# Patient Record
Sex: Male | Born: 1935 | Race: White | Hispanic: No | Marital: Married | Smoking: Former smoker
Health system: Southern US, Community
[De-identification: ages and names within clinical notes are randomized; demographics above are authoritative.]

## PROBLEM LIST (undated history)

## (undated) DIAGNOSIS — I447 Left bundle-branch block, unspecified: Secondary | ICD-10-CM

## (undated) DIAGNOSIS — I639 Cerebral infarction, unspecified: Secondary | ICD-10-CM

## (undated) DIAGNOSIS — Z86711 Personal history of pulmonary embolism: Secondary | ICD-10-CM

## (undated) DIAGNOSIS — Z8673 Personal history of transient ischemic attack (TIA), and cerebral infarction without residual deficits: Secondary | ICD-10-CM

## (undated) DIAGNOSIS — F329 Major depressive disorder, single episode, unspecified: Secondary | ICD-10-CM

## (undated) DIAGNOSIS — IMO0001 Reserved for inherently not codable concepts without codable children: Secondary | ICD-10-CM

## (undated) DIAGNOSIS — F0390 Unspecified dementia without behavioral disturbance: Secondary | ICD-10-CM

## (undated) DIAGNOSIS — N3941 Urge incontinence: Secondary | ICD-10-CM

## (undated) DIAGNOSIS — H409 Unspecified glaucoma: Secondary | ICD-10-CM

## (undated) DIAGNOSIS — I10 Essential (primary) hypertension: Secondary | ICD-10-CM

## (undated) DIAGNOSIS — F32A Depression, unspecified: Secondary | ICD-10-CM

## (undated) DIAGNOSIS — Z86718 Personal history of other venous thrombosis and embolism: Secondary | ICD-10-CM

## (undated) DIAGNOSIS — N400 Enlarged prostate without lower urinary tract symptoms: Secondary | ICD-10-CM

## (undated) HISTORY — PX: REPLACEMENT TOTAL KNEE BILATERAL: SUR1225

## (undated) HISTORY — PX: JOINT REPLACEMENT: SHX530

## (undated) HISTORY — PX: OTHER SURGICAL HISTORY: SHX169

---

## 2013-03-10 ENCOUNTER — Emergency Department (HOSPITAL_COMMUNITY): Payer: Medicare HMO

## 2013-03-10 ENCOUNTER — Encounter (HOSPITAL_COMMUNITY): Payer: Self-pay | Admitting: *Deleted

## 2013-03-10 ENCOUNTER — Inpatient Hospital Stay (HOSPITAL_COMMUNITY)
Admission: EM | Admit: 2013-03-10 | Discharge: 2013-03-12 | DRG: 312 | Disposition: A | Discharge status: Home / Self Care | Payer: Medicare HMO | Attending: Family Medicine | Admitting: Family Medicine

## 2013-03-10 DIAGNOSIS — G8911 Acute pain due to trauma: Secondary | ICD-10-CM

## 2013-03-10 DIAGNOSIS — I951 Orthostatic hypotension: Principal | ICD-10-CM | POA: Diagnosis present

## 2013-03-10 DIAGNOSIS — F32A Depression, unspecified: Secondary | ICD-10-CM

## 2013-03-10 DIAGNOSIS — E785 Hyperlipidemia, unspecified: Secondary | ICD-10-CM | POA: Diagnosis present

## 2013-03-10 DIAGNOSIS — H6120 Impacted cerumen, unspecified ear: Secondary | ICD-10-CM | POA: Diagnosis present

## 2013-03-10 DIAGNOSIS — N138 Other obstructive and reflux uropathy: Secondary | ICD-10-CM | POA: Diagnosis present

## 2013-03-10 DIAGNOSIS — Z86711 Personal history of pulmonary embolism: Secondary | ICD-10-CM

## 2013-03-10 DIAGNOSIS — Z86718 Personal history of other venous thrombosis and embolism: Secondary | ICD-10-CM

## 2013-03-10 DIAGNOSIS — F0280 Dementia in other diseases classified elsewhere without behavioral disturbance: Secondary | ICD-10-CM | POA: Diagnosis present

## 2013-03-10 DIAGNOSIS — N3941 Urge incontinence: Secondary | ICD-10-CM | POA: Diagnosis present

## 2013-03-10 DIAGNOSIS — G309 Alzheimer's disease, unspecified: Secondary | ICD-10-CM | POA: Diagnosis present

## 2013-03-10 DIAGNOSIS — F0391 Unspecified dementia with behavioral disturbance: Secondary | ICD-10-CM | POA: Insufficient documentation

## 2013-03-10 DIAGNOSIS — F3289 Other specified depressive episodes: Secondary | ICD-10-CM | POA: Diagnosis present

## 2013-03-10 DIAGNOSIS — E861 Hypovolemia: Secondary | ICD-10-CM | POA: Diagnosis present

## 2013-03-10 DIAGNOSIS — Z8673 Personal history of transient ischemic attack (TIA), and cerebral infarction without residual deficits: Secondary | ICD-10-CM

## 2013-03-10 DIAGNOSIS — F0390 Unspecified dementia without behavioral disturbance: Secondary | ICD-10-CM

## 2013-03-10 DIAGNOSIS — I440 Atrioventricular block, first degree: Secondary | ICD-10-CM | POA: Diagnosis present

## 2013-03-10 DIAGNOSIS — F329 Major depressive disorder, single episode, unspecified: Secondary | ICD-10-CM | POA: Insufficient documentation

## 2013-03-10 DIAGNOSIS — I10 Essential (primary) hypertension: Secondary | ICD-10-CM

## 2013-03-10 DIAGNOSIS — Z96659 Presence of unspecified artificial knee joint: Secondary | ICD-10-CM

## 2013-03-10 DIAGNOSIS — R55 Syncope and collapse: Secondary | ICD-10-CM

## 2013-03-10 DIAGNOSIS — H409 Unspecified glaucoma: Secondary | ICD-10-CM | POA: Insufficient documentation

## 2013-03-10 DIAGNOSIS — N401 Enlarged prostate with lower urinary tract symptoms: Secondary | ICD-10-CM | POA: Diagnosis present

## 2013-03-10 HISTORY — DX: Personal history of pulmonary embolism: Z86.711

## 2013-03-10 HISTORY — DX: Unspecified glaucoma: H40.9

## 2013-03-10 HISTORY — DX: Depression, unspecified: F32.A

## 2013-03-10 HISTORY — DX: Personal history of other venous thrombosis and embolism: Z86.718

## 2013-03-10 HISTORY — DX: Major depressive disorder, single episode, unspecified: F32.9

## 2013-03-10 HISTORY — DX: Benign prostatic hyperplasia without lower urinary tract symptoms: N40.0

## 2013-03-10 HISTORY — DX: Unspecified dementia, unspecified severity, without behavioral disturbance, psychotic disturbance, mood disturbance, and anxiety: F03.90

## 2013-03-10 HISTORY — DX: Urge incontinence: N39.41

## 2013-03-10 HISTORY — DX: Essential (primary) hypertension: I10

## 2013-03-10 HISTORY — DX: Personal history of transient ischemic attack (TIA), and cerebral infarction without residual deficits: Z86.73

## 2013-03-10 LAB — COMPREHENSIVE METABOLIC PANEL
ALT: 10 U/L (ref 0–53)
AST: 28 U/L (ref 0–37)
Alkaline Phosphatase: 66 U/L (ref 39–117)
Calcium: 9.3 mg/dL (ref 8.4–10.5)
GFR calc Af Amer: 55 mL/min — ABNORMAL LOW (ref >90)
Glucose, Bld: 127 mg/dL — ABNORMAL HIGH (ref 70–99)
Potassium: 4.7 mEq/L (ref 3.5–5.1)
Sodium: 138 mEq/L (ref 135–145)
Total Protein: 6.4 g/dL (ref 6.0–8.3)

## 2013-03-10 LAB — URINE MICROSCOPIC-ADD ON

## 2013-03-10 LAB — CBC WITH DIFFERENTIAL/PLATELET
Basophils Absolute: 0 10*3/uL (ref 0.0–0.1)
Eosinophils Absolute: 0.2 10*3/uL (ref 0.0–0.7)
Lymphocytes Relative: 17 % (ref 12–46)
Lymphs Abs: 1.4 10*3/uL (ref 0.7–4.0)
MCH: 30.4 pg (ref 26.0–34.0)
Neutrophils Relative %: 75 % (ref 43–77)
Platelets: 173 10*3/uL (ref 150–400)
RBC: 4.34 MIL/uL (ref 4.22–5.81)
RDW: 13.6 % (ref 11.5–15.5)
WBC: 8.4 10*3/uL (ref 4.0–10.5)

## 2013-03-10 LAB — URINALYSIS, ROUTINE W REFLEX MICROSCOPIC
Glucose, UA: NEGATIVE mg/dL
Hgb urine dipstick: NEGATIVE
Ketones, ur: NEGATIVE mg/dL
Nitrite: NEGATIVE
Specific Gravity, Urine: 1.024 (ref 1.005–1.030)
pH: 6 (ref 5.0–8.0)

## 2013-03-10 LAB — D-DIMER, QUANTITATIVE: D-Dimer, Quant: 0.83 ug/mL-FEU — ABNORMAL HIGH (ref 0.00–0.48)

## 2013-03-10 MED ORDER — BRIMONIDINE TARTRATE 0.2 % OP SOLN
1.0000 [drp] | Freq: Two times a day (BID) | OPHTHALMIC | Status: DC
  Administered 2013-03-10 – 2013-03-12 (×4): 1 [drp] via OPHTHALMIC
  Filled 2013-03-10: qty 5

## 2013-03-10 MED ORDER — SIMVASTATIN 10 MG PO TABS
10.0000 mg | ORAL_TABLET | Freq: Every day | ORAL | Status: DC
  Administered 2013-03-11: 10 mg via ORAL
  Filled 2013-03-10 (×2): qty 1

## 2013-03-10 MED ORDER — HEPARIN SODIUM (PORCINE) 5000 UNIT/ML IJ SOLN
5000.0000 [IU] | Freq: Three times a day (TID) | INTRAMUSCULAR | Status: DC
  Administered 2013-03-10 – 2013-03-12 (×6): 5000 [IU] via SUBCUTANEOUS
  Filled 2013-03-10 (×8): qty 1

## 2013-03-10 MED ORDER — ACETAMINOPHEN 325 MG PO TABS
650.0000 mg | ORAL_TABLET | Freq: Four times a day (QID) | ORAL | Status: DC | PRN
  Administered 2013-03-10: 650 mg via ORAL
  Filled 2013-03-10: qty 2

## 2013-03-10 MED ORDER — SODIUM CHLORIDE 0.9 % IV BOLUS (SEPSIS)
1000.0000 mL | Freq: Once | INTRAVENOUS | Status: AC
  Administered 2013-03-10: 1000 mL via INTRAVENOUS

## 2013-03-10 MED ORDER — SODIUM CHLORIDE 0.9 % IV SOLN
INTRAVENOUS | Status: AC
  Administered 2013-03-10: 21:00:00 via INTRAVENOUS

## 2013-03-10 MED ORDER — TIMOLOL MALEATE 0.5 % OP SOLN
1.0000 [drp] | Freq: Two times a day (BID) | OPHTHALMIC | Status: DC
  Administered 2013-03-10 – 2013-03-12 (×4): 1 [drp] via OPHTHALMIC
  Filled 2013-03-10: qty 5

## 2013-03-10 MED ORDER — SODIUM CHLORIDE 0.9 % IJ SOLN
3.0000 mL | Freq: Two times a day (BID) | INTRAMUSCULAR | Status: DC
  Administered 2013-03-10 – 2013-03-12 (×3): 3 mL via INTRAVENOUS

## 2013-03-10 MED ORDER — BRIMONIDINE TARTRATE-TIMOLOL 0.2-0.5 % OP SOLN: 1.0000 [drp] | Freq: Two times a day (BID) | OPHTHALMIC | Status: DC

## 2013-03-10 MED ORDER — BRINZOLAMIDE 1 % OP SUSP
1.0000 [drp] | Freq: Two times a day (BID) | OPHTHALMIC | Status: DC
  Administered 2013-03-10 – 2013-03-12 (×4): 1 [drp] via OPHTHALMIC
  Filled 2013-03-10: qty 10

## 2013-03-10 MED ORDER — CLOPIDOGREL BISULFATE 75 MG PO TABS
75.0000 mg | ORAL_TABLET | Freq: Every day | ORAL | Status: DC
  Administered 2013-03-11 – 2013-03-12 (×2): 75 mg via ORAL
  Filled 2013-03-10 (×2): qty 1

## 2013-03-10 MED ORDER — ASPIRIN EC 81 MG PO TBEC
81.0000 mg | DELAYED_RELEASE_TABLET | Freq: Every day | ORAL | Status: DC
  Administered 2013-03-10 – 2013-03-12 (×3): 81 mg via ORAL
  Filled 2013-03-10 (×3): qty 1

## 2013-03-10 MED ORDER — ACETAMINOPHEN 650 MG RE SUPP: 650.0000 mg | Freq: Four times a day (QID) | RECTAL | Status: DC | PRN

## 2013-03-10 MED ORDER — ADULT MULTIVITAMIN W/MINERALS CH
1.0000 | ORAL_TABLET | Freq: Every day | ORAL | Status: DC
  Administered 2013-03-11 – 2013-03-12 (×2): 1 via ORAL
  Filled 2013-03-10 (×2): qty 1

## 2013-03-10 MED ORDER — CITALOPRAM HYDROBROMIDE 20 MG PO TABS
20.0000 mg | ORAL_TABLET | Freq: Every day | ORAL | Status: DC
  Administered 2013-03-11 – 2013-03-12 (×2): 20 mg via ORAL
  Filled 2013-03-10 (×2): qty 1

## 2013-03-10 NOTE — ED Notes (Signed)
Report attempted x 1

## 2013-03-10 NOTE — H&P (Signed)
Family Medicine Teaching [facility] Admission History and Physical Service Pager: [phone]  Patient name: Willie Mayer Medical record number: [id number] Date of birth: [birth year] Age: 79 y.o. Gender: male  Primary Care Provider: Unassigned Consultants: None Code Status: Full Code  Chief Complaint: Presyncope/Syncope  Assessment and Plan: Willie Mayer is a 79 y.o. year old male presenting after a presyncopal or syncopal event caused him to fall backwards today . PMH is significant for history CVA 2.5 years ago (Plavix and ASA), alzheimers dementia recently started on namenda, history DVT/PE 2 years ago, hyperlipidemia.   # Presyncope or Syncope (unclear from history) with orthostatic hypotension Differential includes (arrhythmia, ACS, structural heart disease, PE, orthostatic hypotension from hypovolemia or  Drugs). Doubt CVA or TIA (normal neuro exam. Not planning on CT head at moment), situational (not related to urination, defecation or cough), seizure (no history and no rhythmic movements), hypoglycemia (normal on BMET), carotid etiology (carotid dopplers on 8/10 (through mychart) that showed 16-49% stenosis bilaterally) -orthostatic hypotension most likely cause of syncope or presyncope  -suspect most strongly that this is medication induced (on alpha 1 blocker and namenda as well anticholingergic properties of vesicare placed at risk for falls) with combination of hypovolemia (symptoms improved with hydration) -hold these 3 medications -bed rest or up with 2 person assist -s/p 1 L NS bolus in ED, hydrate 125 hr for 12 hours.  [ ] repeat orthostatics in AM (ordered for 7 am -monitor on telemetry overnight -celexa could prolong qt but none noted on EKG EKG with 1st degree av block and nonspecific conduction delay (does not meet criteria for LBBB as there is no QS or rS in V1 and no RsR' v6) [ ]  AM EKG [ ]  f/u cardiac enzymes x 2 (neg x 1) to rule out ACS [ ]  f/u VQ scan (patient at least at  moderate risk given history DVT/PE) [ ]  f/u 2d echo. Did not appear fluid overloaded so did not order BNP.  *possible etiology of right middle lobe volume loss?  # History CVA-continue plavix and aspirin. No obvious residual deficits but did not have patient stand.   #History of alzheimer's dementia-when establishes with PCP, will need to obtain neuro records. Apparently with recent workup and started on Namenda. Normal 3 word recall in ED.  [ ] obtain MMSE tomorrow  # History DVT/PE-vq scan as above, could consider dopplers of legs as well. AFVSS and no increased work of breathing, tachypnea or hypoxia. Will hold off on anticoagulation for now.   # HLD-continue statin  # Urge Incontinence-hold off on vesicare for now  # BPH-hold home silodosin/rapaflo [ ]  f/u post void residual  # Glaucoma-continue home eye drops  FEN/GI: regular diet Prophylaxis: heparin SQ  Disposition: pending further evaluation and clniical improvement, will need to establish with no PCP in area. Wants to return home. Consider PT consult though will hold off on this until no longer orthostatic.   History of Present Illness: Willie Mayer is a 79 y.o. year old male presenting with presyncope or syncope (not clear from history).   Patient reports standing up from sitting at his independent living facility which he moved to within the last week due to his dementia. When he stood up he became very lightheaded and fell backwards onto the floor. The patient has alzheimer's and wife has parkinsonian dementia (per daughter) and cannot recall all events of the incident. It is not clear whether patient lost consciousness at any point but wife seems to think he had  his eyes open and was talking the whole time. When they attempted to get him onto the stretcher he became lightheaded again. Patient denies any recent or current chest pain, shortness of breath, headache, blurry vision, extremity weakness (other than the fall itself),  orthopnea, PND, shaking of extremities, palpitations, room spinning. Patient noted to have 1st degree av block but wife and daughter state he always has a low HR.   This was similar to an event 2 weeks ago when patient was with daughter. He was standing up and became lightheaded and fell down backwards. This was a day after he had been started on namenda (aug 10 namenda ang aug 11 fall). Did not seek medical help at that time. Patient 2 days ago also reported feeling lightheaded with standing and "head feels funny".   In ED, found to be orthostatic with BP 94/55 sitting to 74/51 standing and reportedly also lightheaded with just sitting up in bed. this improved after a 1L bolus.   Review Of Systems: Per HPI w Otherwise 12 point review of systems was performed and was unremarkable.  Patient Active Problem List   Diagnosis Date Noted  . Dementia   . Hypertension   . History of CVA (cerebrovascular accident)   . History of pulmonary embolism   . Depression   . Glaucoma    Past Medical History: Past Medical History  Diagnosis Date  . Dementia   . Hypertension   . History of CVA (cerebrovascular accident)   . History of DVT (deep vein thrombosis)   . History of pulmonary embolism   . Depression   . Glaucoma   . Urge incontinence   . BPH (benign prostatic hyperplasia)     s/p surgery that "didnt work"   Past Surgical History: Past Surgical History  Procedure Laterality Date  . Replacement total knee bilateral    . Bph surgery     Social History: History  Substance Use Topics  . Smoking status: Former Smoker -- 15.00 packs/day    Quit date: 07/17/1965  . Smokeless tobacco: Never Used  . Alcohol Use: Yes     Comment: occasional gin and tonic, none since moving to [place]   History   Social History Narrative   Recently moved from [place] where he lived with his wife who has parkinsonian dementia. They were independent but moved down here after recent fall and knowledge of dementia.  Live in independent living facility and indendent in [place].    Please also refer to relevant sections of EMR.  Family History: History reviewed. No pertinent family history. Allergies and Medications: No Known Allergies No current facility-administered medications on file prior to encounter.   No current outpatient prescriptions on file prior to encounter.   Scheduled Meds:  Continuous Infusions: . sodium chloride     PRN Meds:.acetaminophen, acetaminophen   Objective: BP 113/58  Pulse 52  Temp(Src) 98.1 F (36.7 C) (Oral)  Resp 18  SpO2 97% Exam: General: no apparent distress, resting slightly uncomfortably in bed (states bed is uncomfortable to his back though no back pain when not laying down) HEENT: Mucous membranes are moist. Extraocular movements intact, hearing aide in left ear, cerumen impaction bilaterally, oropharynx normal Cardiovascular: bradycardic, no murmurs, rubs or gallops. No JVD. No lightheadedness with sitting up.  Respiratory: clear to auscultation bilaterally Abdomen: soft/nontender/nondistended/ No rebound or guarding. No suprapubic discomfort Extremities: no edema Skin: warm, dry Neuro:  CN II-XII intact (couldn't visualize fundus), sensation and reflexes normal throughout, 5/5 muscle  strength in bilateral upper and lower extremities. Normal finger to nose. Normal rapid alternating movements. Did not have patient stand due to recent syncopal event. Did not complete romberg. 3 word recall 3/3. Alert and oriented to (family in room but not president's name although gave physical description, place-hospital in [place], [place], reason for visit-fall, time-year, day of week but thought it was September). Patient seems to forget some details (didn't know why he had moved to [facility]) though.   Labs and Imaging: CBC BMET   Recent Labs Lab 03/10/13 1519  WBC 8.4  HGB 13.2  HCT 38.1*  PLT 173    Recent Labs Lab 03/10/13 1519  NA 138  K 4.7  CL 104  CO2 27   BUN 17  CREATININE 1.38*  GLUCOSE 127*  CALCIUM 9.3      Total Protein 6.4  6.0 - 8.3 g/dL   Albumin 3.1 (*) 3.5 - 5.2 g/dL   AST 28  0 - 37 U/L   ALT 10  0 - 53 U/L   Alkaline Phosphatase 66  39 - 117 U/L   Total Bilirubin 1.0  0.3 - 1.2 mg/dL   GFR calc non Af Amer 47 (*) >90 mL/min   GFR calc Af Amer 55 (*) >90 mL/min  TROPONIN I     Status: None   Collection Time    03/10/13  3:19 PM      Result Value Range   Troponin I <0.30  <0.30 ng/mL  D-DIMER, QUANTITATIVE     Status: Abnormal   Collection Time    03/10/13  3:19 PM      Result Value Range   D-Dimer, Quant 0.83 (*) 0.00 - 0.48 ug/mL-FEU   Dg Chest 2 View  03/10/2013   *RADIOLOGY REPORT*  Clinical Data: Syncope, fall.  CHEST - 2 VIEW  Comparison: None.  Findings: Trachea is midline.  Heart size normal.  There is volume loss in the right middle lobe.  Lungs are otherwise clear.  No pleural fluid.  Flowing anterior osteophytosis in the thoracolumbar spine.  IMPRESSION: Right middle lobe volume loss.  Follow-up to clearing is suggested as a centrally obstructing lesion cannot be excluded.   Original Report Authenticated By: Leanna Battles, M.D.   Shelva Majestic, MD 03/10/2013, 7:05 PM PGY-3, [facility] FPTS Intern pager: [phone], text pages welcome

## 2013-03-10 NOTE — Progress Notes (Addendum)
03/10/2013 9:10 PM  Per patients daughter Georgina Quint [facility]- copy on chart) [phone] patient does have a living will (copy on chart). Pt however requests to remain a full code.; HCPOA in agrees.  Alternative HCPOA Colby Reels absent at this time. MD to be  made aware.   Carlyon Prows RN

## 2013-03-10 NOTE — ED Notes (Signed)
EMS reports that pt. Was at work and went to stand and fell to the ground.  EMS reports that when pt. Sits his B/P is 70/40 and when lying he is 90/50.  EMS also reports that pt. Had an episode like this one week before but did not seek medical attention.  Pt. Does have a PMH of dementia.

## 2013-03-10 NOTE — Progress Notes (Signed)
03/10/2013 7:59 PM  Report received from ED RN.  Order clarification for pulmonary a Perf and Vent. Awaiting patient.   Carlyon Prows RN

## 2013-03-10 NOTE — ED Notes (Signed)
Report given to Oncoming RN.  Pt. Will be transported to floor after floor RN changes shift.

## 2013-03-10 NOTE — ED Provider Notes (Signed)
CSN: [id number]     Arrival date & time 03/10/13  1517 History     First MD Initiated Contact with Patient 03/10/13 1517     Chief Complaint  Patient presents with  . Loss of Consciousness  . Hypotension  . Fall   (Consider location/radiation/quality/duration/timing/severity/associated sxs/prior Treatment) HPI Comments: 79 y.o. M with PMH of dementia and HTN presenting from assisted living facility s/p syncopal episode.  Pt was reportedly arising from wheelchair when he developed lightheadedness and began to fall backwards.  Pt lost consciousness and fell back with impact to buttocks.  He regained consciousness as soon as he was laying down.  No convulsions or post-ictal symptoms.  Pt does not recall how he felt prior to episode.  He states he had a similar episode 1 week ago but did not seek treatment at that time.  He denies HA, vision change, weakness, numbness, chest pain, dyspnea, abdominal pain, N/V/D, or dysuria.  Denies recent fevers or illnesses.  On EMS arrival, pt alert and oriented.  SBP in 70s when sitting, improved to 90s when laying flat.  Vital signs otherwise stable en route.  Patient is a 79 y.o. male presenting with syncope and fall. The history is provided by the patient and the EMS personnel.  Loss of Consciousness Episode history:  Single Most recent episode:  Today Duration:  5 seconds Timing:  Intermittent Progression:  Resolved Chronicity:  Recurrent (second episode this week) Context: standing up   Witnessed: yes   Relieved by:  Lying down Ineffective treatments:  None tried Associated symptoms: no chest pain, no confusion, no diaphoresis, no difficulty breathing, no dizziness, no fever, no focal sensory loss, no focal weakness, no headaches, no nausea, no palpitations, no recent injury, no shortness of breath, no visual change, no vomiting and no weakness   Fall This is a new problem. The current episode started today. Episode frequency: once. The problem has  been unchanged. Pertinent negatives include no abdominal pain, arthralgias, change in bowel habit, chest pain, chills, coughing, diaphoresis, fatigue, fever, headaches, myalgias, nausea, neck pain, numbness, rash, vertigo, visual change, vomiting or weakness. Associated symptoms comments: No head injury. Nothing aggravates the symptoms. He has tried nothing for the symptoms. The treatment provided no relief.    Past Medical History  Diagnosis Date  . Dementia   . Hypertension   . History of CVA (cerebrovascular accident)   . History of DVT (deep vein thrombosis)   . History of pulmonary embolism   . Depression   . Glaucoma   . Urge incontinence   . BPH (benign prostatic hyperplasia)     s/p surgery that "didnt work"   Past Surgical History  Procedure Laterality Date  . Replacement total knee bilateral    . Bph surgery     Family History  Problem Relation Age of Onset  . Alcoholism Father     died in 9s related to complciations   History  Substance Use Topics  . Smoking status: Former Smoker -- 15.00 packs/day    Quit date: 07/17/1965  . Smokeless tobacco: Never Used  . Alcohol Use: Yes     Comment: occasional gin and tonic, none since moving to [place]    Review of Systems  Constitutional: Negative for fever, chills, diaphoresis, appetite change and fatigue.  HENT: Negative for neck pain and neck stiffness.   Eyes: Negative for photophobia and visual disturbance.  Respiratory: Negative for cough, chest tightness, shortness of breath and wheezing.   Cardiovascular: Positive  for syncope. Negative for chest pain, palpitations and leg swelling.  Gastrointestinal: Negative for nausea, vomiting, abdominal pain, diarrhea, blood in stool, abdominal distention and change in bowel habit.  Genitourinary: Negative for dysuria, urgency, frequency, flank pain, decreased urine volume and difficulty urinating.  Musculoskeletal: Negative for myalgias, back pain, arthralgias and gait problem.   Skin: Negative for color change, pallor and rash.  Neurological: Positive for syncope and light-headedness (prior to syncopal episode). Negative for dizziness, vertigo, focal weakness, facial asymmetry, speech difficulty, weakness, numbness and headaches.  Psychiatric/Behavioral: Negative for confusion.  All other systems reviewed and are negative.    Allergies  Review of patient's allergies indicates no known allergies.  Home Medications  No current outpatient prescriptions on file. BP 146/76  Pulse 62  Temp(Src) 97.8 F (36.6 C) (Oral)  Resp 18  Ht 6\' 2"  (1.88 m)  Wt 237 lb (107.502 kg)  BMI 30.42 kg/m2  SpO2 100% Physical Exam  Nursing note and vitals reviewed. Constitutional: He is oriented to person, place, and time. He appears well-developed and well-nourished. No distress.  HENT:  Head: Normocephalic and atraumatic.  Eyes: EOM are normal. Pupils are equal, round, and reactive to light.  Neck: Normal range of motion. Neck supple.  Cardiovascular: Regular rhythm, normal heart sounds and intact distal pulses.  Bradycardia present.   Pulmonary/Chest: Effort normal and breath sounds normal. No respiratory distress. He has no wheezes. He has no rales.  Abdominal: Soft. Bowel sounds are normal. He exhibits no distension. There is no tenderness. There is no rebound and no guarding.  Musculoskeletal: Normal range of motion. He exhibits no edema and no tenderness.  Neurological: He is alert and oriented to person, place, and time. He has normal strength. No cranial nerve deficit or sensory deficit. He exhibits normal muscle tone. He displays a negative Romberg sign. Coordination normal. GCS eye subscore is 4. GCS verbal subscore is 5. GCS motor subscore is 6.  Normal finger-to-nose  Skin: Skin is warm and dry. No rash noted. He is not diaphoretic.    ED Course   Procedures (including critical care time)  Labs Reviewed  CBC WITH DIFFERENTIAL - Abnormal; Notable for the  following:    HCT 38.1 (*)    All other components within normal limits  COMPREHENSIVE METABOLIC PANEL - Abnormal; Notable for the following:    Glucose, Bld 127 (*)    Creatinine, Ser 1.38 (*)    Albumin 3.1 (*)    GFR calc non Af Amer 47 (*)    GFR calc Af Amer 55 (*)    All other components within normal limits  URINALYSIS, ROUTINE W REFLEX MICROSCOPIC - Abnormal; Notable for the following:    Bilirubin Urine SMALL (*)    Leukocytes, UA SMALL (*)    All other components within normal limits  D-DIMER, QUANTITATIVE - Abnormal; Notable for the following:    D-Dimer, Quant 0.83 (*)    All other components within normal limits  TROPONIN I  URINE MICROSCOPIC-ADD ON  TROPONIN I  BASIC METABOLIC PANEL   Dg Chest 2 View  03/10/2013   *RADIOLOGY REPORT*  Clinical Data: Syncope, fall.  CHEST - 2 VIEW  Comparison: None.  Findings: Trachea is midline.  Heart size normal.  There is volume loss in the right middle lobe.  Lungs are otherwise clear.  No pleural fluid.  Flowing anterior osteophytosis in the thoracolumbar spine.  IMPRESSION: Right middle lobe volume loss.  Follow-up to clearing is suggested as a centrally obstructing  lesion cannot be excluded.   Original Report Authenticated By: Leanna Battles, M.D.   1. Syncope   2. Hypertension   3. History of CVA (cerebrovascular accident)   4. History of pulmonary embolism   5. Depression   6. Glaucoma      Date: 03/10/2013  Rate: 55  Rhythm: sinus bradycardia  QRS Axis: normal  Intervals: normal  ST/T Wave abnormalities: normal  Conduction Disutrbances:first-degree A-V block  and nonspecific intraventricular conduction delay  Narrative Interpretation:   Old EKG Reviewed: none available   MDM  79 y.o. M with PMH of dementia and HTN presenting s/p syncopal episode in which he arose from chair, felt lightheaded, then syncopized.  Regained consciousness as soon as he was laying flat.  Pt noted by EMS to be hypotensive in 70s/40s when  sitting up with improvement in BP to 90s/50s when laying flat.  No seizure like activity or post-ictal symptoms.   No head injury per EMS.  On arrival, pt alert and oriented x 3, in NAD.  He is currently asymptomatic.  BP when laying down in 90s/50s. Bradycardia present with HR of 56. Vital signs otherwise stable.  Physical exam unremarkable- no evidence of trauma.  Neuro exam with no focal deficits. Symptoms orthostatic appearing.  Pt does not take beta-blockers. Will check EKG, troponin, labs, CXR, U/A.   D-dimer obtained as pt has prior h/o PE although currently denies chest pain or dyspnea.  EKG shows sinus bradycardia with intraventricular conduction delay, 1st degree AV block. CXR shows R middle lobe volume loss.  Troponin negative.  CBC with no leukocytosis or anemia.  D-dimer elevated to 0.83.  V/Q scan ordered.  Pt to be admitted given recurrent orthostatic hypotension and syncope.  Stable at time of admission.  Discussed with attending Dr. Criss Alvine.     Jodean Lima, MD 03/11/13 [phone]

## 2013-03-11 ENCOUNTER — Inpatient Hospital Stay (HOSPITAL_COMMUNITY): Payer: Medicare HMO

## 2013-03-11 DIAGNOSIS — I10 Essential (primary) hypertension: Secondary | ICD-10-CM

## 2013-03-11 DIAGNOSIS — R55 Syncope and collapse: Secondary | ICD-10-CM

## 2013-03-11 DIAGNOSIS — M545 Low back pain: Secondary | ICD-10-CM

## 2013-03-11 DIAGNOSIS — G8911 Acute pain due to trauma: Secondary | ICD-10-CM

## 2013-03-11 LAB — BASIC METABOLIC PANEL
BUN: 15 mg/dL (ref 6–23)
CO2: 25 mEq/L (ref 19–32)
Chloride: 107 mEq/L (ref 96–112)
Creatinine, Ser: 1.14 mg/dL (ref 0.50–1.35)
Potassium: 4 mEq/L (ref 3.5–5.1)

## 2013-03-11 LAB — TROPONIN I: Troponin I: <0.3 ng/mL (ref <0.30)

## 2013-03-11 MED ORDER — TECHNETIUM TO 99M ALBUMIN AGGREGATED
6.0000 | Freq: Once | INTRAVENOUS | Status: AC | PRN
  Administered 2013-03-11: 6 via INTRAVENOUS

## 2013-03-11 MED ORDER — ACETAMINOPHEN 650 MG RE SUPP
650.0000 mg | Freq: Four times a day (QID) | RECTAL | Status: DC
  Filled 2013-03-11 (×4): qty 1

## 2013-03-11 MED ORDER — ACETAMINOPHEN 325 MG PO TABS
650.0000 mg | ORAL_TABLET | Freq: Four times a day (QID) | ORAL | Status: DC
  Administered 2013-03-11 – 2013-03-12 (×5): 650 mg via ORAL
  Filled 2013-03-11 (×5): qty 2

## 2013-03-11 MED ORDER — TECHNETIUM TC 99M DIETHYLENETRIAME-PENTAACETIC ACID: 40.0000 | Freq: Once | INTRAVENOUS | Status: AC | PRN

## 2013-03-11 NOTE — Progress Notes (Signed)
03/10/13  2130  MD made aware of pt's HCPOA concerns that pt remain a full code. Living will and HCPOA paperwork on chart. Per Dr. Durene Cal. Pt to remain a full code at this time. Family and patient  updated. Pt c/o severe back pain with position changes from laying, to sitting to standing. MD made aware. Tylenol to be given for this discomfort, maintain bedrest and reevaluate in AM. Questionable compression fracture from fall??? Post void residual 132 ml via bladder scan. MD aware.  No new orders received.   Carlyon Prows RN

## 2013-03-11 NOTE — Progress Notes (Signed)
Family Medicine Teaching Service Daily Progress Note Intern Pager: [phone]  Patient name: Willie Mayer Medical record number: [id number] Date of birth: [birth year] Age: 79 y.o. Gender: male  Primary Care Provider: Pcp Not In System Consultants: none Code Status: Full   Pt Overview and Major Events to Date:   Assessment and Plan: Willie Mayer is a 79 y.o. year old male presenting after a presyncopal or syncopal event caused him to fall backwards today . PMH is significant for history CVA 2.5 years ago (Plavix and ASA), alzheimers dementia recently started on namenda, history DVT/PE 2 years ago, hyperlipidemia.   # Presyncope or Syncope (unclear from history) with orthostatic hypotension  Differential includes (arrhythmia, ACS, structural heart disease, PE, orthostatic hypotension from hypovolemia or Drugs). Doubt CVA or TIA (normal neuro exam. Not planning on CT head at moment), situational (not related to urination, defecation or cough), seizure (no history and no rhythmic movements), hypoglycemia (normal on BMET), carotid etiology (carotid dopplers on 8/10 (through mychart) that showed 16-49% stenosis bilaterally)  -orthostatic hypotension most likely cause of syncope or presyncope  -suspect most strongly that this is medication induced (on alpha 1 blocker and namenda as well anticholingergic properties of vesicare placed at risk for falls) with combination of hypovolemia (symptoms improved with hydration)  -hold these 3 medications  -bed rest or up with 2 person assist  -s/p 1 L NS bolus in ED, hydrate 125 hr for 12 hours.  [ ] repeat orthostatics in AM (ordered for 7 am  -monitor on telemetry overnight  -celexa could prolong qt but none noted on EKG  -EKG with 1st degree av block and nonspecific conduction delay (does not meet criteria for LBBB as there is no QS or rS in V1 and no RsR' v6)  - trop (-) x 2  - CXR: right middle lobe volume loss  [ ]  AM EKG    [ ]  f/u VQ scan (patient at least at  moderate risk given history DVT/PE)  [ ]  f/u 2d echo.    #Back Pain - complaining of centralized lumbar pain. Non radiating, localized. 7-8/10. No muscle/stregnth deficts. Exacerbated on movement. No spasm felt on palpation. New pain due to fall? Or chronic pain.  - Tylenol 650 mg PRN for pain  [ ]  Lumbar spine x-ray  [ ]  f/u PT/OT rec's    # History CVA-continue plavix and aspirin. No obvious residual deficits but did not have patient stand.   #History of alzheimer's dementia-when establishes with PCP, will need to obtain neuro records. Apparently with recent workup and started on Namenda. Normal 3 word recall in ED.  [ ] obtain MMSE today or in outpatient setting   # History DVT/PE-vq scan as above, could consider dopplers of legs as well. AFVSS and no increased work of breathing, tachypnea or hypoxia. Will hold off on anticoagulation for now.   # HLD-continue statin   # Urge Incontinence-hold off on vesicare for now   # BPH-hold home silodosin/rapaflo  - 132 mL   # Glaucoma-continue home eye drops   FEN/GI: regular diet  Prophylaxis: heparin SQ  Disposition: pending further evaluation and clniical improvement, will need to establish with no PCP in area. Wants to return home.   Subjective: Patient had no overnight events. He is urinating with no difficulty. Only reports of 7-8/10 back pain in his lumbar region. It only occurs when he moves and doesn't radiate proximally or distally.   Objective: Temp:  [97.7 F (36.5 C)-98.1 F (36.7 C)] 97.7 F (  36.5 C) (08/26 9147) Pulse Rate:  [52-78] 61 (08/26 0634) Resp:  [15-20] 18 (08/26 0608) BP: (74-146)/(51-93) 138/72 mmHg (08/26 0634) SpO2:  [97 %-100 %] 99 % (08/26 0608) Weight:  [236 lb 4.8 oz (107.185 kg)-237 lb (107.502 kg)] 236 lb 4.8 oz (107.185 kg) (08/26 8295) Physical Exam: General: no apparent distress, resting slightly uncomfortably in bed (states bed is uncomfortable to his back though no back pain when not laying  down)  Cardiovascular: no murmurs, rubs or gallops. No JVD. No lightheadedness with sitting up.  Respiratory: clear to auscultation bilaterally  Abdomen: soft/nontender/nondistended/ No rebound or guarding. No suprapubic discomfort  MSK: no paraspinal tenderness, normal strength and sensation in LE  Extremities: no edema  Skin: warm, dry  Neuro: Alert and Oriented x 4 (date, person, city, place), able to stand    Laboratory:  Recent Labs Lab 03/10/13 1519  WBC 8.4  HGB 13.2  HCT 38.1*  PLT 173    Recent Labs Lab 03/10/13 1519 03/11/13 0519  NA 138 140  K 4.7 4.0  CL 104 107  CO2 27 25  BUN 17 15  CREATININE 1.38* 1.14  CALCIUM 9.3 8.7  PROT 6.4  --   BILITOT 1.0  --   ALKPHOS 66  --   ALT 10  --   AST 28  --   GLUCOSE 127* 91      Recent Labs Lab 03/10/13 1519 03/10/13 2330  TROPONINI <0.30 <0.30   8/25: D-Dimer: 0.83 (H)   Imaging/Diagnostic Tests: Dg Chest 2 View  03/10/2013  IMPRESSION: Right middle lobe volume loss. Follow-up to clearing is suggested as a centrally obstructing lesion cannot be excluded.   Clare Gandy, MD 03/11/2013, 6:59 AM PGY-1, [facility] Family Medicine FPTS Intern pager: [phone], text pages welcome

## 2013-03-11 NOTE — Plan of Care (Signed)
Problem: Phase I Progression Outcomes Goal: Hemodynamically stable Outcome: Progressing othostatic bp *1 competed on admission to unit. MD aware. 0700 8/26 pending

## 2013-03-11 NOTE — H&P (Signed)
I examined this patient and discussed the care plan with Dr Durene Cal  and the [facility] team and agree with assessment and plan as documented in the admission note above. Due to significant low back pain after the prior fall, I agree that he should have x-rays to include the lower thoracic spine.

## 2013-03-11 NOTE — Progress Notes (Signed)
PGY-2 Update Note  Discussed pt's case with daughter. Explained that we are looking into various reasons for his ?syncope and falls, and explained that his xrays have shown no fractures. Explained to her that we think his falls could be a combination of several issues (unsteadiness on his feet/balance issues, dehydration and low blood pressure, contribution of medications which we are holding, etc). Daughter explained that pt's mother "ended up with a pacemaker after some falls and passing out about this age."  Will plan to continue per previous notes and follow up on echocardiogram, etc. Will need to consider follow up with cardiology this admission vs outpt after discharge. Will also follow up PT/OT recommendations (unable to evaluate patient today) for possible acute rehab vs home health, etc. Daughter was appreciative of explanations and had no further questions.  Bobbye Morton, MD PGY-2, [facility] Family Medicine 03/11/2013, 5:40 PM FPTS Service pager: [phone] (text pages welcome through [facility])

## 2013-03-11 NOTE — Plan of Care (Signed)
Problem: Phase I Progression Outcomes Goal: OOB as tolerated unless otherwise ordered Outcome: Not Progressing Pt curently bedrest d/t fall and c/o sudden onset back pain with movement

## 2013-03-11 NOTE — Progress Notes (Signed)
  Echocardiogram 2D Echocardiogram has been performed.  Georgian Co 03/11/2013, 9:49 AM

## 2013-03-11 NOTE — Plan of Care (Signed)
Problem: Phase I Progression Outcomes Goal: Pain controlled with appropriate interventions Outcome: Progressing Pt c/o back pain from fall d/t syncope episode.  MD aware.

## 2013-03-11 NOTE — Progress Notes (Signed)
PT Cancellation Note  Patient Details Name: Willie Mayer MRN: [id number] DOB: 06-May-1933   Cancelled Treatment:    Reason Eval/Treat Not Completed: Medical issues which prohibited therapy Awaiting lumbar xray results.   [name],[name] 03/11/2013, 11:23 AM Zenovia Jarred, PT, DPT 03/11/2013 Pager: [phone]

## 2013-03-11 NOTE — ED Provider Notes (Signed)
I saw and evaluated the patient, reviewed the resident's note and I agree with the findings and plan.   2 unprovoked syncopal episodes in last week. No other sx, complicated by dementia. With low BP will need admission for further w/u.   Audree Camel, MD 03/11/13 1047

## 2013-03-11 NOTE — Progress Notes (Signed)
FMTS Attending Daily Note:  Renold Don MD  [phone] pager  Family Practice pager:  [phone] I have discussed this patient with the resident Dr. Jordan Likes and attending physician Dr. Sheffield Slider.  I agree with their findings, assessment, and care plan

## 2013-03-11 NOTE — Progress Notes (Signed)
CSW (Clinical Child psychotherapist) informed that pt may be from ALF. CSW spoke with pt daughter. Pt is from [facility] and is with the Independent Living area. CSW signing off.  Damyiah Moxley, LCSWA [phone]

## 2013-03-12 DIAGNOSIS — F0390 Unspecified dementia without behavioral disturbance: Secondary | ICD-10-CM

## 2013-03-12 DIAGNOSIS — Z8673 Personal history of transient ischemic attack (TIA), and cerebral infarction without residual deficits: Secondary | ICD-10-CM

## 2013-03-12 MED ORDER — POLYETHYLENE GLYCOL 3350 17 G PO PACK: 17.0000 g | PACK | Freq: Every day | ORAL | Status: DC | PRN

## 2013-03-12 MED ORDER — POLYETHYLENE GLYCOL 3350 17 G PO PACK
17.0000 g | PACK | Freq: Every day | ORAL | Status: DC | PRN
  Administered 2013-03-12: 17 g via ORAL
  Filled 2013-03-12 (×2): qty 1

## 2013-03-12 NOTE — Evaluation (Signed)
Physical Therapy Evaluation Patient Details Name: Willie Mayer MRN: [id number] DOB: 04-Aug-1932 Today's Date: 03/12/2013 Time: 1204-1219 PT Time Calculation (min): 15 min  PT Assessment / Plan / Recommendation History of Present Illness  Willie Mayer is a 79 y.o. year old male presenting after a presyncopal or syncopal event caused him to fall backwards today . PMH is significant for history CVA 2.5 years ago (Plavix and ASA), alzheimers dementia recently started on namenda, history DVT/PE 2 years ago, hyperlipidemia.   Clinical Impression  Patient demonstrates good overall mobility with use of rw. No acute PT needs. Anticipate patient will be safe for back to independent living facility with spouse. Will need RW for discharge.    PT Assessment  Patent does not need any further PT services    Follow Up Recommendations  Supervision/Assistance - 24 hour          Equipment Recommendations  Rolling walker with 5" wheels          Precautions / Restrictions Precautions Precautions: Fall   Pertinent Vitals/Pain No pain at this time      Mobility  Bed Mobility Bed Mobility: Supine to Sit;Sitting - Scoot to Edge of Bed Supine to Sit: 7: Independent Sitting - Scoot to Edge of Bed: 7: Independent Details for Bed Mobility Assistance: no assist needed Transfers Transfers: Sit to Stand;Stand to Sit Sit to Stand: 7: Independent Stand to Sit: 7: Independent Ambulation/Gait Ambulation/Gait Assistance: 5: Supervision;4: Min guard Ambulation Distance (Feet): 200 Feet Assistive device: Rolling walker Ambulation/Gait Assistance Details: some instability noted, better with use of rw Gait Pattern: Step-through pattern Gait velocity: initially decreased General Gait Details: improved stability with use of rw         Visit Information  Last PT Received On: 03/12/13 Assistance Needed: +1 History of Present Illness: Willie Mayer is a 79 y.o. year old male presenting after a presyncopal or syncopal  event caused him to fall backwards today . PMH is significant for history CVA 2.5 years ago (Plavix and ASA), alzheimers dementia recently started on namenda, history DVT/PE 2 years ago, hyperlipidemia.        Prior Functioning  Home Living Family/patient expects to be discharged to:: Assisted living (independent living) Home Equipment: None Prior Function Level of Independence: Independent Communication Communication: No difficulties Dominant Hand: Right    Cognition  Cognition Arousal/Alertness: Awake/alert Behavior During Therapy: WFL for tasks assessed/performed Overall Cognitive Status: Within Functional Limits for tasks assessed    Extremity/Trunk Assessment Upper Extremity Assessment Upper Extremity Assessment: Defer to OT evaluation Lower Extremity Assessment Lower Extremity Assessment: Overall WFL for tasks assessed   Balance Balance Balance Assessed: Yes Static Sitting Balance Static Sitting - Balance Support: Feet supported Static Sitting - Level of Assistance: 7: Independent Dynamic Standing Balance Dynamic Standing - Balance Support: No upper extremity supported;During functional activity Dynamic Standing - Level of Assistance: 7: Independent Dynamic Standing - Balance Activities:  (putting on belt) High Level Balance High Level Balance Activites: Side stepping;Backward walking;Direction changes;Turns High Level Balance Comments: good stability with use of rw  End of Session PT - End of Session Equipment Utilized During Treatment: Gait belt Activity Tolerance: Patient tolerated treatment well Patient left: in chair;with call bell/phone within reach Nurse Communication: Mobility status;Other (comment) (needs rw)  GP     Fabio Asa 03/12/2013, 12:57 PM Charlotte Crumb, PT DPT  [phone]

## 2013-03-12 NOTE — Evaluation (Signed)
Occupational Therapy Evaluation Patient Details Name: Willie Mayer MRN: [id number] DOB: 01/10/1933 Today's Date: 03/12/2013 Time: 1205-1226 OT Time Calculation (min): 21 min  OT Assessment / Plan / Recommendation History of present illness Willie Mayer is a 79 y.o. year old male presenting after a presyncopal or syncopal event caused him to fall backwards today . PMH is significant for history CVA 2.5 years ago (Plavix and ASA), alzheimers dementia recently started on namenda, history DVT/PE 2 years ago, hyperlipidemia.    Clinical Impression   Pt admitted with above. Spoke with Case manager Willie Mayer after eval session and she reported that pt's family had questions about obtaining information for driving rehab services. Provided case manager with information for drivers [facility] in [place].  Will sign off.    OT Assessment  Patient does not need any further OT services    Follow Up Recommendations  No OT follow up;Supervision/Assistance - 24 hour    Barriers to Discharge      Equipment Recommendations   (RW)    Recommendations for Other Services    Frequency       Precautions / Restrictions Precautions Precautions: Fall   Pertinent Vitals/Pain See vitals    ADL  Eating/Feeding: Performed;Independent Where Assessed - Eating/Feeding: Chair Grooming: Performed;Supervision/safety Where Assessed - Grooming: Unsupported standing Upper Body Bathing: Simulated;Supervision/safety Where Assessed - Upper Body Bathing: Unsupported sitting Lower Body Bathing: Simulated;Supervision/safety Where Assessed - Lower Body Bathing: Unsupported sit to stand Upper Body Dressing: Performed;Supervision/safety Where Assessed - Upper Body Dressing: Unsupported sitting Lower Body Dressing: Performed;Supervision/safety Where Assessed - Lower Body Dressing: Unsupported sit to stand Toilet Transfer: Simulated;Supervision/safety Toilet Transfer Method:  (ambulating) Acupuncturist:   (bed) Equipment Used: Gait belt;Rolling walker Transfers/Ambulation Related to ADLs: supervision with RW. ADL Comments: Pt with difficulty answering questions regarding PLOF and home setup (h/o dementia) but otherwise cognition appropriate during session.    OT Diagnosis:    OT Problem List:   OT Treatment Interventions:     OT Goals(Current goals can be found in the care plan section)    Visit Information  Last OT Received On: 03/12/13 Assistance Needed: +1 PT/OT Co-Evaluation/Treatment: Yes History of Present Illness: Willie Mayer is a 79 y.o. year old male presenting after a presyncopal or syncopal event caused him to fall backwards today . PMH is significant for history CVA 2.5 years ago (Plavix and ASA), alzheimers dementia recently started on namenda, history DVT/PE 2 years ago, hyperlipidemia.        Prior Functioning     Home Living Family/patient expects to be discharged to:: Assisted living (independent living) Home Equipment: None Prior Function Level of Independence: Independent Communication Communication: No difficulties Dominant Hand: Right         Vision/Perception     Cognition  Cognition Arousal/Alertness: Awake/alert Behavior During Therapy: WFL for tasks assessed/performed Overall Cognitive Status: History of cognitive impairments - at baseline    Extremity/Trunk Assessment Upper Extremity Assessment Upper Extremity Assessment: Overall WFL for tasks assessed Lower Extremity Assessment Lower Extremity Assessment: Overall WFL for tasks assessed     Mobility Bed Mobility Bed Mobility: Supine to Sit;Sitting - Scoot to Edge of Bed Supine to Sit: 7: Independent Sitting - Scoot to Edge of Bed: 7: Independent Details for Bed Mobility Assistance: no assist needed Transfers Transfers: Stand to Sit;Sit to Stand Sit to Stand: 7: Independent Stand to Sit: 7: Independent     Exercise     Balance Balance Balance Assessed: Yes Static Sitting  Balance Static Sitting -  Balance Support: Feet supported Static Sitting - Level of Assistance: 7: Independent Dynamic Standing Balance Dynamic Standing - Balance Support: No upper extremity supported;During functional activity Dynamic Standing - Level of Assistance: 7: Independent Dynamic Standing - Balance Activities:  (putting on belt) High Level Balance High Level Balance Activites: Side stepping;Backward walking;Direction changes;Turns High Level Balance Comments: good stability with use of rw   End of Session OT - End of Session Equipment Utilized During Treatment: Gait belt;Rolling walker Activity Tolerance: Patient tolerated treatment well Patient left: in chair;with call bell/phone within reach Nurse Communication: Mobility status  GO   03/12/2013 Willie Mayer OTR/L Pager [phone] Office [phone]   Willie Mayer 03/12/2013, 2:40 PM

## 2013-03-12 NOTE — Progress Notes (Signed)
Patient's post-void residual was 27 ml. [place], Mitzi Hansen

## 2013-03-12 NOTE — Care Management (Signed)
CM provided pt's daughter information to [facility] in [place], [place]. Daughter will f/u. No further needs form CM. Gala Lewandowsky, RN,BSN [phone]

## 2013-03-12 NOTE — Progress Notes (Signed)
Family Medicine Teaching Service Daily Progress Note Intern Pager: [phone]  Patient name: Willie Mayer Medical record number: [id number] Date of birth: 1932-09-02 Age: 79 y.o. Gender: male  Primary Care Provider: Pcp Not In System Consultants: none Code Status: Full   Pt Overview and Major Events to Date:   Assessment and Plan: Willie Mayer is a 79 y.o. year old male presenting after a presyncopal or syncopal event caused him to fall backwards today . PMH is significant for history CVA 2.5 years ago (Plavix and ASA), alzheimers dementia recently started on namenda, history DVT/PE 2 years ago, hyperlipidemia.   # Presyncope or Syncope (unclear from history) with orthostatic hypotension  Differential includes (arrhythmia, ACS, structural heart disease, PE, orthostatic hypotension from hypovolemia or Drugs). Doubt CVA or TIA (normal neuro exam. Not planning on CT head at moment), situational (not related to urination, defecation or cough), seizure (no history and no rhythmic movements), hypoglycemia (normal on BMET), carotid etiology (carotid dopplers on 8/10 (through mychart) that showed 16-49% stenosis bilaterally)  -orthostatic hypotension most likely cause of syncope or presyncope  -suspect most strongly that this is medication induced (on alpha 1 blocker and namenda as well anticholingergic properties of vesicare placed at risk for falls) with combination of hypovolemia (symptoms improved with hydration)  -hold these 3 medications  -bed rest or up with 2 person assist  -s/p 1 L NS bolus in ED, hydrate 125 hr for 12 hours.  -monitor on telemetry overnight  -EKG with 1st degree av block and nonspecific conduction delay (does not meet criteria for LBBB as there is no QS or rS in V1 and no RsR' v6)  - trop (-) x 2  - CXR: right middle lobe volume loss  [ ]  AM EKG  - 2D ECHO - normal - repeated orthostatic vitals this AM - Negative (137/76 (63) --> 136/66 (66) --> 130/67 (77)) [ ]  contacted [facility], plan to call patient to establish Cardiology follow-up on discharge  #Back Pain - complaining of centralized lumbar pain. Non radiating, localized. 7-8/10. No muscle/stregnth deficts. Exacerbated on movement. No spasm felt on palpation. New pain due to fall? Or chronic pain.  - Tylenol 650 mg PRN for pain  [ ]  Lumbar spine x-ray - No acute [ ]  f/u PT/OT rec's - 24 hour supervision, ordered Rolling Walker  # History CVA-continue plavix and aspirin. No obvious residual deficits but did not have patient stand.   #History of alzheimer's dementia-when establishes with PCP, will need to obtain neuro records. Apparently with recent workup and started on Namenda. Normal 3 word recall in ED.  [ ] obtain MMSE today or in outpatient setting   # History DVT/PE-vq scan as above, could consider dopplers of legs as well. AFVSS and no increased work of breathing, tachypnea or hypoxia. Will hold off on anticoagulation for now.   # HLD-continue statin   # Urge Incontinence-hold off on vesicare for now  - HOLD Vesicare on discharge, due to concern for anticholinergic side effects  # BPH-hold home silodosin/rapaflo  - PVR today 27mL - minimal urinary retention, plan to restart Rapaflo  # Glaucoma-continue home eye drops   FEN/GI: regular diet  Prophylaxis: heparin SQ  Disposition: pending further evaluation and clniical improvement, will need to establish with no PCP in area. Likely discharge today. Scheduled Cardiology follow-up as outpt  Subjective: Patient has no complaints today. Feels better. No dizziness, lightheaded, no CP or SoB. Able to stand and ambulate comfortably.  Objective: Temp:  [97.6  F (36.4 C)-98.1 F (36.7 C)] 97.7 F (36.5 C) (08/27 0406) Pulse Rate:  [59-74] 59 (08/27 0406) Resp:  [18-20] 20 (08/27 0406) BP: (107-172)/(60-87) 150/86 mmHg (08/27 0406) SpO2:  [98 %-99 %] 98 % (08/27 0406) Weight:  [234 lb (106.142 kg)] 234 lb (106.142 kg) (08/27 0406) Physical  Exam: General: no apparent distress, resting slightly uncomfortably in bed (states bed is uncomfortable to his back though no back pain when not laying down)  Cardiovascular: no murmurs, rubs or gallops. No JVD. No lightheadedness with sitting up.  Respiratory: clear to auscultation bilaterally  Abdomen: soft/nontender/nondistended/ No rebound or guarding. No suprapubic discomfort  MSK: no paraspinal tenderness, normal strength and sensation in LE  Extremities: no edema  Skin: warm, dry  Neuro: Alert and Oriented x 4 (date, person, city, place), able to stand    Laboratory:  Recent Labs Lab 03/10/13 1519  WBC 8.4  HGB 13.2  HCT 38.1*  PLT 173    Recent Labs Lab 03/10/13 1519 03/11/13 0519  NA 138 140  K 4.7 4.0  CL 104 107  CO2 27 25  BUN 17 15  CREATININE 1.38* 1.14  CALCIUM 9.3 8.7  PROT 6.4  --   BILITOT 1.0  --   ALKPHOS 66  --   ALT 10  --   AST 28  --   GLUCOSE 127* 91       Recent Labs Lab 03/10/13 1519 03/10/13 2330  TROPONINI <0.30 <0.30   8/25: D-Dimer: 0.83 (H)   Imaging/Diagnostic Tests: Dg Chest 2 View  03/10/2013  IMPRESSION: Right middle lobe volume loss. Follow-up to clearing is suggested as a centrally obstructing lesion cannot be excluded.  8/26 Lumbar Thoracic Xrays IMPRESSION:  Lumbar degenerative changes. Negative for fracture. IMPRESSION:  Mild thoracic disc degeneration. No acute bony abnormality.  8/26 EKG Sinus bradycardia, 1st degree AV Block, LVH with QRS widening and repolarization abnormality  8/26 VQ Scan IMPRESSION:  1. There are some bandlike ventilation defects in the mid lungs  reason the possibility of atelectasis along the major fissures, but  no significant perfusion defect to suggest pulmonary embolus.   8/26 - 2D ECHO - Left ventricle: The cavity size was normal. Wall thickness was normal. Systolic function was normal. The estimated ejection fraction was in the range of 55% to 60%. Wall motion was normal;  there were no regional wall motion abnormalities. Diastolic function is indeterminate. - Left atrium: Poorly visualized - though appears normal sized. - Systemic veins: The IVC was not visualized.  Saralyn Pilar, DO 03/12/2013, 7:32 AM PGY-1, [facility] FPTS Intern pager: [phone], text pages welcome

## 2013-03-12 NOTE — Progress Notes (Signed)
Physical Therapy Discharge Patient Details Name: Willie Mayer MRN: [id number] DOB: 06/29/1933 Today's Date: 03/12/2013 Time: 1204-1219 PT Time Calculation (min): 15 min  Patient discharged from PT services secondary to goals met and no further PT needs identified.  Please see latest therapy progress note for current level of functioning and progress toward goals.    Progress and discharge plan discussed with patient and/or caregiver: Patient/Caregiver agrees with plan  GP     Fabio Asa 03/12/2013, 12:59 PM

## 2013-03-12 NOTE — Care Management (Signed)
Case Manager did speak to pt and daughter and they are agreeable to a Agricultural consultant- rollator with a seat. Cm did fax information to [employer] and if pt is approved they will deliver RW to room today before the pt is d/c. CM will provide pt with information regarding PCP, Urologist and Neurology MD's since the pt is new to [place]. [employer] Liaison  Erika to fax information to hospital. CM did contact [place] with OT to see if there is a [facility] Center to see if pt is in condition to drive. Jenna to contact CM with information. No further needs form CM at this time. Gala Lewandowsky, RN,BSN [phone]

## 2013-03-12 NOTE — Progress Notes (Signed)
FMTS Attending Daily Note:  Renold Don MD  [phone] pager  Family Practice pager:  [phone] I have seen and examined this patient and have reviewed their chart. I have discussed this patient with the resident. I agree with the resident's findings, assessment and care plan.  Additionally:  Patient improved greatly.  Plan for DC home today.  Will have followup outpatient with cardiology for any further outpatient studies.  Agree likely medication related/dehydration.  Tele strip/EKG negative here.  We will set him up for outpatient follow-up with [place] as well.    Tobey Grim, MD 03/12/2013 1:23 PM

## 2013-03-12 NOTE — Progress Notes (Signed)
DC orders received.  Patient stable with no S/S of distress.  Medication and discharge information reviewed with patient and patient's wife & daughter.  Patient DC home with family. [place], Mitzi Hansen

## 2013-03-14 NOTE — Discharge Summary (Signed)
Family Medicine Teaching [facility] Discharge Summary  Patient name: Willie Mayer Medical record number: [id number] Date of birth: 14-Aug-1932 Age: 79 y.o. Gender: male Date of Admission: 03/10/2013  Date of Discharge: 03/12/13 Admitting Physician: Zachery Dauer, MD  Primary Care Provider: Pcp Not In System Consultants: none  Indication for Hospitalization: Presyncope/Syncope   Discharge Diagnoses/Problem List:  Presyncope/Syncope - resolved  Dementia HTN History of CVA Glaucoma BPH  History of DVT/PT HLD  Back pain   Disposition: home   Discharge Condition: Stable   Brief Hospital Course:  Willie Mayer is a 79 y.o. year old male presenting after a presyncopal or syncopal event caused him to fall backwards upon day of admission. PMH is significant for history CVA 2.5 years ago (Plavix and ASA), alzheimers dementia recently started on namenda, history DVT/PE 2 years ago, hyperlipidemia.   # Presyncope or Syncope (unclear from history) with orthostatic hypotension Suspected to be medication induced with combination of hypovolemia. Patient's home medications included Namenda and Vesicare and were held upon admission. R/o other potential causes such as CVA/TIA due to normal neuro exam, seizure due to no reported history, hypoglycemia with normal BMET, carotid etiology (carotid dopplers on 8/10 (through mychart) that showed 16-49% stenosis bilaterally) and troponin (-) x 2 and normal 2D ECHO.  CXR showed middle lobe volume loss. [facility] contacted and planted to call patient to establish care upon discharge since patient is new to the area. Hypovolemia improved with fluids and orthostatics were negative prior to discharge. Namenda and Vesicare were held upon discharge.    #Back Pain - Complaining of centralized lumbar pain. Non radiating, localized. 7-8/10. No muscle/stregnth deficts. Exacerbated on movement. No spasm felt on palpation. Lumbar x-ray showed Lumbar degenerative changes  and  negative for fracture. Thoracic x-ray showed mild thoracic disc degeneration and no acute bony abnormality.  Ordered Rollining walker per PT to help with ADL's.    # History CVA-continue plavix and aspirin.    #History of alzheimer's dementia-when establishes with PCP, will need to obtain neuro records.  Normal 3 word recall in ED. Oriented to city, date, hospital, and person during daily exams. Will need to obtain MMSE in outpatient setting.    # History DVT/PE- D-dimer was elevated at 0.83 but V/Q scan showed there are some bandlike ventilation defects in the mid lungs reason the possibility of atelectasis along the major fissures, but no significant perfusion defect to suggest pulmonary embolus. AFVSS and no increased work of breathing, tachypnea or hypoxia. Will hold off on anticoagulation for now.   # HLD-continue statin   # Urge Incontinence-Vesicare was held initially at admission due to anticholinergic side effects contributing to his symptoms. Due to this, Vesicare was held on discharge   # BPH-hold home silodosin/rapaflo due to suspected contribution to his presyncope/syncope symptoms. Post residual volume shoed minimal urinary retention (27 mL). Rapaflo was restarted at discharge but Silodosin (Vesicare) held upon discharge.    # Glaucoma-continue home eye drops   Issues for Follow Up:  1. Presyncope/Syncope with hypotension: resolved prior to discharge. Namenda and Vesicare were held upon discharge.  2. History of Alzhemier's: obtain MMSE in outpatient setting. Obtain neuro records from previous provider as to why started on Namenda recently.  3. Back pain - determine if still in pain. Chronic changes on imaging.  4. Urge Incontinence - Vesicare held on discharge. Determine if having any symptoms.  5. BPH - showed minimal residual volume retention. Vesicare held on discharge but continued on Rapaflo. Having  any symptoms.   Significant Procedures: none  Significant Labs and  Imaging:   Recent Labs Lab 03/10/13 1519  WBC 8.4  HGB 13.2  HCT 38.1*  PLT 173    Recent Labs Lab 03/10/13 1519 03/11/13 0519  NA 138 140  K 4.7 4.0  CL 104 107  CO2 27 25  GLUCOSE 127* 91  BUN 17 15  CREATININE 1.38* 1.14  CALCIUM 9.3 8.7  ALKPHOS 66  --   AST 28  --   ALT 10  --   ALBUMIN 3.1*  --      Recent Labs Lab 03/10/13 1519 03/10/13 2330  TROPONINI <0.30 <0.30    D-dimer: 0.83  Dg Chest 2 View  03/10/2013 IMPRESSION: Right middle lobe volume loss. Follow-up to clearing is suggested as a centrally obstructing lesion cannot be excluded.   8/26 Lumbar Thoracic Xrays  IMPRESSION:  Lumbar degenerative changes. Negative for fracture.  IMPRESSION:  Mild thoracic disc degeneration. No acute bony abnormality.   8/26 EKG  Sinus bradycardia, 1st degree AV Block, LVH with QRS widening and repolarization abnormality   8/26 VQ Scan  IMPRESSION:  1. There are some bandlike ventilation defects in the mid lungs  reason the possibility of atelectasis along the major fissures, but  no significant perfusion defect to suggest pulmonary embolus.   8/26 - 2D ECHO  - Left ventricle: The cavity size was normal. Wall thickness was normal. Systolic function was normal. The estimated ejection fraction was in the range of 55% to 60%. Wall motion was normal; there were no regional wall motion abnormalities. Diastolic function is indeterminate. - Left atrium: Poorly visualized - though appears normal sized. - Systemic veins: The IVC was not visualized.   Results/Tests Pending at Time of Discharge: none  Discharge Medications:    Medication List    STOP taking these medications       memantine 10 MG tablet  Commonly known as:  NAMENDA     solifenacin 5 MG tablet  Commonly known as:  VESICARE      TAKE these medications       aspirin EC 81 MG tablet  Take 81 mg by mouth daily.     brinzolamide 1 % ophthalmic suspension  Commonly known as:  AZOPT   Place 1 drop into the right eye 2 (two) times daily.     citalopram 20 MG tablet  Commonly known as:  CELEXA  Take 20 mg by mouth daily.     clopidogrel 75 MG tablet  Commonly known as:  PLAVIX  Take 75 mg by mouth daily.     COMBIGAN 0.2-0.5 % ophthalmic solution  Generic drug:  brimonidine-timolol  Place 1 drop into both eyes every 12 (twelve) hours.     multivitamin with minerals Tabs tablet  Take 1 tablet by mouth daily.     polyethylene glycol packet  Commonly known as:  MIRALAX / GLYCOLAX  Take 17 g by mouth daily as needed.     pravastatin 20 MG tablet  Commonly known as:  PRAVACHOL  Take 20 mg by mouth daily.     silodosin 8 MG Caps capsule  Commonly known as:  RAPAFLO  Take 8 mg by mouth daily with breakfast.        Discharge Instructions: Please refer to Patient Instructions section of EMR for full details.  Patient was counseled important signs and symptoms that should prompt return to medical care, changes in medications, dietary instructions, activity restrictions, and follow up  appointments.   Follow-Up Appointments: Follow-up Information   Follow up with Clare Gandy, MD On 03/21/2013. (2:00 pm )    Specialty:  Family Medicine   Contact information:   [address] [place] [address] [phone]       Follow up with [facility] [address] (they will call you with details)    Contact information:   [address] [place] [address]       Clare Gandy, MD 03/17/2013, 10:31 AM PGY-1, [facility]

## 2013-03-17 NOTE — Discharge Summary (Signed)
Family Medicine Teaching Service  Discharge Note : Attending Jeff Keenan Trefry MD Pager [phone] Inpatient Team Pager:  [phone]  I have reviewed this patient and the patient's chart and have discussed discharge planning with the resident at the time of discharge. I agree with the discharge plan as above.    

## 2013-03-21 ENCOUNTER — Encounter: Payer: Self-pay | Admitting: Family Medicine

## 2013-03-21 ENCOUNTER — Ambulatory Visit (INDEPENDENT_AMBULATORY_CARE_PROVIDER_SITE_OTHER): Payer: Medicare HMO | Admitting: Family Medicine

## 2013-03-21 VITALS — BP 113/62 | HR 105 | Temp 97.8°F | Ht 74.0 in | Wt 234.8 lb

## 2013-03-21 DIAGNOSIS — M545 Low back pain: Secondary | ICD-10-CM

## 2013-03-21 DIAGNOSIS — R55 Syncope and collapse: Secondary | ICD-10-CM

## 2013-03-21 DIAGNOSIS — F0390 Unspecified dementia without behavioral disturbance: Secondary | ICD-10-CM

## 2013-03-21 NOTE — Progress Notes (Signed)
  [facility]  Patient name: Willie Mayer MRN [id number]  Date of birth: [birth year]  CC & HPI:  Willie Mayer is a 79 y.o. male presenting today for follow up from a recent hospitalization:  he was admitted for: syncope/ presyncope orthostatic hypotension from medication. Details found in discharge summary.  Symptoms have Improved.   Chief Complaint  Patient presents with  . Establish Care   #Back pain: severe lumbar pain while in the hospital.   #Dementia: Per son in law, he used to be a huge [employer] and enjoyed watching them on TV but doesn't find that interesting anymore. Previous health records need to be released from his previous provider.   ------------------------------------------------------------------------------------------------------------------ Medications:  compliant all of the time   Noted Med Effects:  Vesicare and Namenda due to suspected contribution to hospitalization      ROS:  PER HPI  Pertinent History Reviewed:  Medical & Surgical Hx:  Reviewed Medications: Reviewed & Updated - see associated section Social History: Reviewed -  reports that he quit smoking about 47 years ago. He has never used smokeless tobacco.  Objective Findings:  BP 113/62  Pulse 105  Temp(Src) 97.8 F (36.6 C) (Oral)  Ht 6\' 2"  (1.88 m)  Wt 234 lb 12.8 oz (106.505 kg)  BMI 30.13 kg/m2  Gen: NAD, alert, cooperative with exam CV: RRR, good S1/S2, no murmur Resp: CTABL, no wheezes, non-labored MSK: normal gait, normal sensation in upper and lower extremities, 5/5 in upper and lower extremities.  Ext: No edema, warm Neuro: Alert and oriented, No gross deficits, MMSE 26/30.    Assessment & Plan:  See problem associated charting

## 2013-03-21 NOTE — Patient Instructions (Addendum)
Thank you for coming in,    If anything changes please call in for a follow up in regards to your near passing out. We are not changing any medications as of right now. Once we get your medical records from your previous doctors then we will determine if we need to do any further blood work.   I can be your primary doctor if you choose to. My name is Dr. Clare Gandy.     Please feel free to call with any questions or concerns at any time, at [phone]. --Dr. Jordan Likes

## 2013-03-23 ENCOUNTER — Encounter: Payer: Self-pay | Admitting: Family Medicine

## 2013-03-23 DIAGNOSIS — M545 Low back pain: Secondary | ICD-10-CM | POA: Insufficient documentation

## 2013-03-23 DIAGNOSIS — R55 Syncope and collapse: Secondary | ICD-10-CM | POA: Insufficient documentation

## 2013-03-23 NOTE — Assessment & Plan Note (Signed)
MMSE 26/30. Will retrieve records from previous Neurologist.  - obtain reason as to why Namenda was initiated. Still holding.  - currently lives with his wife at [place]  - doesn't find joy in previous activities but negative depression screening.  - will follow up in a month if symptoms worsen

## 2013-03-23 NOTE — Assessment & Plan Note (Signed)
Recently discharged from hospital for presyncope or syncope most likely to be medication induced.  - Has not occurred since discharge - Holding Vesicare and Candiss Norse  - will obtain records in order to determine why medications were started by previous provider

## 2013-03-23 NOTE — Assessment & Plan Note (Signed)
Much improved since the hospital. Able to stand up with no pain.  - follow up if symptoms persist if or worsen

## 2013-04-23 ENCOUNTER — Encounter: Payer: Self-pay | Admitting: Family Medicine

## 2013-04-23 ENCOUNTER — Ambulatory Visit (INDEPENDENT_AMBULATORY_CARE_PROVIDER_SITE_OTHER): Payer: Medicare HMO | Admitting: Family Medicine

## 2013-04-23 VITALS — BP 110/68 | HR 72 | Temp 98.0°F | Ht 73.5 in | Wt 236.0 lb

## 2013-04-23 DIAGNOSIS — F0391 Unspecified dementia with behavioral disturbance: Secondary | ICD-10-CM

## 2013-04-23 DIAGNOSIS — F0280 Dementia in other diseases classified elsewhere without behavioral disturbance: Secondary | ICD-10-CM

## 2013-04-23 DIAGNOSIS — R32 Unspecified urinary incontinence: Secondary | ICD-10-CM

## 2013-04-23 DIAGNOSIS — F03918 Unspecified dementia, unspecified severity, with other behavioral disturbance: Secondary | ICD-10-CM

## 2013-04-23 DIAGNOSIS — H9190 Unspecified hearing loss, unspecified ear: Secondary | ICD-10-CM

## 2013-04-23 DIAGNOSIS — Z23 Encounter for immunization: Secondary | ICD-10-CM

## 2013-04-23 DIAGNOSIS — R5381 Other malaise: Secondary | ICD-10-CM

## 2013-04-23 DIAGNOSIS — H9193 Unspecified hearing loss, bilateral: Secondary | ICD-10-CM

## 2013-04-23 LAB — CBC
HCT: 42.4 % (ref 39.0–52.0)
MCH: 29.7 pg (ref 26.0–34.0)
MCHC: 33.3 g/dL (ref 30.0–36.0)
MCV: 89.5 fL (ref 78.0–100.0)
RDW: 13.6 % (ref 11.5–15.5)
WBC: 7.9 10*3/uL (ref 4.0–10.5)

## 2013-04-23 NOTE — Patient Instructions (Signed)
Thank you for coming in,   I'm going to do some lab work in order to determine if there is some vitamin deficiency or thyroid disease. Please let me know if your symptoms get worse.   I put in a referral to Dr. Arbutus Leas and an ENT referral for you to get evaluated for new hearing aids.   You received the flu vaccine and prevnair 13 to keep you up to date on vaccines.    Please feel free to call with any questions or concerns at any time, at [phone]. --Dr. Jordan Likes

## 2013-04-23 NOTE — Assessment & Plan Note (Signed)
History of hearing loss. Old hearing aids do not fit.  - referral made to ENT for evaluation by Audiologist

## 2013-04-23 NOTE — Assessment & Plan Note (Signed)
H/O incontinence with past surgery on prostate with no alleviation.  - currently wearing depends  - daughter gives history as patient doesn't realize he is incontinent  - Previous Urologist prescribed Vesicare but was stopped due to recent episode of syncope. Currently on Rapilfo for BPH.  - will watch for now and attain records from previous doctor.

## 2013-04-23 NOTE — Assessment & Plan Note (Addendum)
Patient has h/o Alzhemier's with increased behavioral outbursts.  - Daughter would like to establish care at a Neurologist.  - Referral was placed with Dr. Arbutus Leas (Neurologist)  - Aricept could be used in further treatment pending lab results

## 2013-04-23 NOTE — Progress Notes (Signed)
Patient ID: Willie Mayer, male   DOB: 1933-04-25, 79 y.o.   MRN: [id number] Subjective:    CC: Establish care.   HPI: 79 yo White M with a PMH BPH, CVA, syncope, DVT, Glaucoma, and Alzheimer's. He recently moved to [place] to be closer to his daughter. He and his wife currently reside at [facility]. His daughter is with him in the exam room today. She is doing most of the reporting due to the patients dementia.   Urinary Incontinence: He has a 1-2 year history. He has a history of BPH as well and has had surgery for his prostate. He is currently wearing depends and has several accidents during the day. He goes several times during the day and night. He denies fever, chills, pain, burning or bloody urine.   Fatigue: He has a 2 week increase in the amount of fatigue that he feels. He goes to bed early at night and does not wake up refreshed. He looks forward to eating during the day but still takes a nap in the afternoon. His daughter described his sleepy as "hard." He complains of this extreme exhaustion but denies any depression or sadness. He denies any palpations, or tarry stools, or decrease in appetite. He use to be a social butterfly when he was younger and daughter says that he is just not acting like he normally would.   Hearing loss: he has a h/o of hearing loss bilaterally with the use of hearing aids. He recently quit using his hearing aids because they don't fit his ears anymore.    Past Medical History  Diagnosis Date  . Dementia   . Hypertension   . History of CVA (cerebrovascular accident)   . History of DVT (deep vein thrombosis)   . History of pulmonary embolism   . Depression   . Glaucoma   . Urge incontinence   . BPH (benign prostatic hyperplasia)     s/p surgery that "didnt work"     Family History  Problem Relation Age of Onset  . Alcoholism Father     died in 66s related to complciations  . Heart failure Mother    History   Social History  . Marital Status:  Married    Spouse Name: N/A    Number of Children: N/A  . Years of Education: N/A   Occupational History  . Insurance     Social History Main Topics  . Smoking status: Former Smoker -- 15.00 packs/day    Quit date: 07/17/1965  . Smokeless tobacco: Never Used  . Alcohol Use: Yes     Comment: occasional gin and tonic, none since moving to [place]  . Drug Use: No  . Sexual Activity: No   Other Topics Concern  . Not on file   Social History Narrative   Recently moved from [place] where he lived with his wife who has parkinsonian dementia. They were independent but moved down here after recent fall and knowledge of dementia. Live in independent living facility and indendent in ADLs.       Currently living at [facility] living. Lives with his wife. They help with medication delivery and activities of daily life.      Review of Systems: All other systems reviewed and otherwise normal. .  Objective:   BP 110/68  Pulse 72  Temp(Src) 98 F (36.7 C) (Oral)  Ht 6' 1.5" (1.867 m)  Wt 236 lb (107.049 kg)  BMI 30.71 kg/m2 General: Well Developed, well nourished,  and in no acute distress.  Neuro: Alert and oriented x3, extra-ocular muscles intact, sensation grossly intact. CN 2-12 intact except he didn't respond to the hearing test  HEENT: Normocephalic, atraumatic, pupils equal round reactive to light, neck supple, no masses, no lymphadenopathy, thyroid nonpalpable.  Skin: Warm and dry, no rashes noted.  Cardiac: Regular rate and rhythm, no murmurs rubs or gallops.  Respiratory: Clear to auscultation bilaterally. Not using accessory muscles, speaking in full sentences.  Abdominal: Soft, nontender, nondistended, positive bowel sounds, no masses, no organomegaly.  Musculoskeletal: Shoulder, elbow, wrist, hip, knee, ankle stable, and with full range of motion.   Impression and Recommendations:    The patient was counselled, risk factors were discussed, anticipatory guidance  given. 79

## 2013-04-23 NOTE — Assessment & Plan Note (Signed)
Increasing fatigue as of late regardless of rest and sleep.  - TSH, B12, Vit D, and CBC collected today  - Still maintains good appetite, denies depression or sadness - he feels like he could lay down on the exam table and fall asleep

## 2013-04-24 ENCOUNTER — Other Ambulatory Visit: Payer: Self-pay | Admitting: Family Medicine

## 2013-04-24 DIAGNOSIS — E559 Vitamin D deficiency, unspecified: Secondary | ICD-10-CM | POA: Insufficient documentation

## 2013-04-24 LAB — TSH: TSH: 1.612 u[IU]/mL (ref 0.350–4.500)

## 2013-04-24 MED ORDER — VITAMIN D (ERGOCALCIFEROL) 1.25 MG (50000 UNIT) PO CAPS: 50000.0000 [IU] | ORAL_CAPSULE | ORAL | Status: DC

## 2013-04-24 NOTE — Assessment & Plan Note (Signed)
Vit d Deficiency.  - Will start 50,000 Units once weekly for 8 weeks  - follow up after that 8 weeks and start a daily dosing

## 2013-04-27 ENCOUNTER — Telehealth: Payer: Self-pay | Admitting: Family Medicine

## 2013-04-27 NOTE — Telephone Encounter (Signed)
Pts daughter called the after hours line.  Pt w/ near syncope while taking hot shower. Family later checked BP and reported SBP of 103 lying and 79 standing. No other symptoms reported.  This was addressed during last hospitalization. Vesicare and namenda DC.  Discussed vasovagal, intravascular depletion, autonomic dysregulation vs other cardiac anamoly.  Pt has not been able to f/u w/ cards yet Recommending hydration, close monitoring and call [facility] on Monday for appt next wk. Will fwd to PCP to assist w/ f/u  Shelly Flatten, MD Family Medicine PGY-3 04/27/2013, 2:36 PM

## 2013-04-28 ENCOUNTER — Telehealth: Payer: Self-pay | Admitting: Family Medicine

## 2013-04-28 NOTE — Telephone Encounter (Signed)
Daughter called to remind Dr. Jordan Likes  To call the referral over to  [facility]. They have an appointment set up at the end of the month but they need the referral from [place]. JW

## 2013-04-29 ENCOUNTER — Telehealth: Payer: Self-pay | Admitting: Family Medicine

## 2013-04-29 NOTE — Telephone Encounter (Signed)
Daughter called back to inform Dr. Jordan Likes that her dad's timeline is as follows. August he was in the hospital, he has had 2 appointments, 1 hospital f/u and then a new patient visit. Then on Saturday he had the episode in the shower which was 10/12 and she relayed all the information to Dr. Konrad Dolores . JW

## 2013-05-06 ENCOUNTER — Ambulatory Visit (INDEPENDENT_AMBULATORY_CARE_PROVIDER_SITE_OTHER): Payer: Medicare HMO | Admitting: Family Medicine

## 2013-05-06 ENCOUNTER — Encounter: Payer: Self-pay | Admitting: Family Medicine

## 2013-05-06 VITALS — BP 104/61 | HR 73 | Temp 98.8°F | Ht 74.0 in | Wt 234.0 lb

## 2013-05-06 DIAGNOSIS — R55 Syncope and collapse: Secondary | ICD-10-CM

## 2013-05-06 DIAGNOSIS — F0391 Unspecified dementia with behavioral disturbance: Secondary | ICD-10-CM

## 2013-05-06 DIAGNOSIS — R32 Unspecified urinary incontinence: Secondary | ICD-10-CM

## 2013-05-06 DIAGNOSIS — R5381 Other malaise: Secondary | ICD-10-CM

## 2013-05-06 DIAGNOSIS — M545 Low back pain: Secondary | ICD-10-CM

## 2013-05-06 NOTE — Patient Instructions (Signed)
Your vitals were within normal range today. I also agree with cardiology consultation for your condition since this has happened again after your hospitalization. Follow up with your primary doctor in a month or sooner if needed.

## 2013-05-06 NOTE — Assessment & Plan Note (Signed)
Another episode 9 days ago. This is the 3rd reported per daughter. BP borderline low but no orthostatism today.  Unclear etiology, agree with more extensive work up per cardiology and neurology.

## 2013-05-06 NOTE — Assessment & Plan Note (Addendum)
TSH, B12 and CBC wnl. Vit D low. Pt was started on Vit D treatment.

## 2013-05-06 NOTE — Assessment & Plan Note (Signed)
Has overflow incontinence and still taking Rapaflo.  With this pre-syncopal episodes risk and benefits of this medication has been evaluated. Pt may benefit form Urology consultation. I recommended to avoid this medication as possible.

## 2013-05-06 NOTE — Assessment & Plan Note (Signed)
Per pt it has resolved.

## 2013-05-06 NOTE — Assessment & Plan Note (Signed)
MMSE was not done today due to assessing other complex issues at this visit. He was oriented X3 with good mood and no agitation.  Pt was instructed to f/u with primary doctor to further assess his condition.

## 2013-05-06 NOTE — Progress Notes (Signed)
Family Medicine Office Visit Note   Subjective:   Patient ID: Willie Mayer, male  DOB: [birth year], 79 y.o.. MRN: [id number]   Pt that comes today complaining of another pre-syncopal episode. Pt was admitted recently due to pre-syncope/syncope with hypotension thought to be secondary to hypovolemia and medication induced (Namenda and Vesicare were held upon discharge). Daughter (primary caregiver) brings him today since he had another event on 04/27/13. She reports he was taking a hot shower and was found leaning over the sink, he was transferred to bed and episode resolved. Pt then was afraid to get up late that day. He never fell, BP were normal right after the event. Pt does not recall with clarity the details of the episode.  He continues having overflow incontinence (secondary to BPH) for which he takes Rapaflo.  Pt has appointment scheduled with Cardiology for tomorrow at 10:00am  Regarding his other issues to follow up: 1. Back pain has resolved per pt's report 2. We still don't have available Neurology records. Per daughter reports he was recently diagnosed with Alzheimer Dementia.  Review of Systems:  Pt denies SOB, chest pain, palpitations, headaches, dizziness, numbness or weakness.   Objective:   Physical Exam: Gen:  NAD, good humor and cooperative with examination.  HEENT: Moist mucous membranes  CV: Regular rate and rhythm, no murmurs rubs or gallops. BP sitting:106/65, lying: 103/63, standing: 101/61 PULM: Clear to auscultation bilaterally. No wheezes/rales/rhonchi ABD: Soft, non tender, non distended, normal bowel sounds EXT: No edema Neuro: Alert and oriented in person, place and time. No focalization. MMSE was not done today due to length of visit.  Psych: normal affect and mood. Normal speech and thought process. No agitation.  Assessment & Plan:

## 2013-05-07 ENCOUNTER — Encounter: Payer: Self-pay | Admitting: Cardiology

## 2013-05-07 ENCOUNTER — Ambulatory Visit (INDEPENDENT_AMBULATORY_CARE_PROVIDER_SITE_OTHER): Payer: Medicare HMO | Admitting: Cardiology

## 2013-05-07 VITALS — BP 118/68 | HR 68 | Ht 74.0 in | Wt 237.4 lb

## 2013-05-07 DIAGNOSIS — F0391 Unspecified dementia with behavioral disturbance: Secondary | ICD-10-CM

## 2013-05-07 DIAGNOSIS — R0609 Other forms of dyspnea: Secondary | ICD-10-CM

## 2013-05-07 DIAGNOSIS — Z8673 Personal history of transient ischemic attack (TIA), and cerebral infarction without residual deficits: Secondary | ICD-10-CM

## 2013-05-07 DIAGNOSIS — I10 Essential (primary) hypertension: Secondary | ICD-10-CM

## 2013-05-07 DIAGNOSIS — R0600 Dyspnea, unspecified: Secondary | ICD-10-CM

## 2013-05-07 DIAGNOSIS — I951 Orthostatic hypotension: Secondary | ICD-10-CM

## 2013-05-07 NOTE — Patient Instructions (Signed)
Liberalize salt intake and stay well hydrated.  Continue your current therapy  If orthostatic hypotension recurs may need to consider stopping Rapaflo.

## 2013-05-07 NOTE — Progress Notes (Signed)
Willie Mayer Date of Birth: 1932/08/22 Medical Record #[id number]  History of Present Illness: Willie Mayer is seen at the request of Dr.  Likes for evaluation of orthostatic dizziness. Patient is a pleasant 79 year old white male. He has a history of Alzheimer's dementia. He is seen with his daughter who provides the history. Patient has a history of prior CVA 2 years ago. About 2 months following this he had a DVT with pulmonary embolus. He has had progressive memory loss and was diagnosed this past summer with Alzheimer's disease. He was started on Namenda. The following day he was dancing at a family function and he suddenly fell down. The daughter reports that he had had a couple of drinks. About 2 weeks later he had an episode getting up from the dining room table where he fainted. He was admitted to the hospital. He was found to be orthostatic. Namenda and VESIcare were held. Since then he had done well up until October 12. After taking a shower he was found leaning over his sink gasping for breath. His daughter reports that he was orthostatic again at that time. It is noted that he had an echocardiogram in the hospital in August. This was unremarkable. He apparently has had previous cardiac evaluation with stress testing in [place] but none recently. He has no symptoms of chest pain or shortness of breath. His biggest problem is a lack of energy. His daughter reports that he will get up and eat, then go right back to bed and spends most of the day in the bed watching TV.  Current Outpatient Prescriptions on File Prior to Visit  Medication Sig Dispense Refill  . brimonidine-timolol (COMBIGAN) 0.2-0.5 % ophthalmic solution Place 1 drop into both eyes every 12 (twelve) hours.      . brinzolamide (AZOPT) 1 % ophthalmic suspension Place 1 drop into the right eye 2 (two) times daily.      . citalopram (CELEXA) 20 MG tablet Take 20 mg by mouth daily.      . clopidogrel (PLAVIX) 75 MG tablet Take 75 mg by  mouth daily.      . Multiple Vitamin (MULTIVITAMIN WITH MINERALS) TABS tablet Take 1 tablet by mouth daily.      . pravastatin (PRAVACHOL) 20 MG tablet Take 20 mg by mouth daily.      . silodosin (RAPAFLO) 8 MG CAPS capsule Take 8 mg by mouth daily with breakfast.      . Vitamin D, Ergocalciferol, (DRISDOL) 50000 UNITS CAPS capsule Take 1 capsule (50,000 Units total) by mouth once a week.  8 capsule  0   No current facility-administered medications on file prior to visit.    No Known Allergies  Past Medical History  Diagnosis Date  . Dementia   . Hypertension   . History of CVA (cerebrovascular accident)   . History of DVT (deep vein thrombosis)   . History of pulmonary embolism   . Depression   . Glaucoma   . Urge incontinence   . BPH (benign prostatic hyperplasia)     s/p surgery that "didnt work"    Past Surgical History  Procedure Laterality Date  . Replacement total knee bilateral    . Bph surgery      History  Smoking status  . Former Smoker -- 15.00 packs/day  . Quit date: 07/17/1965  Smokeless tobacco  . Never Used    History  Alcohol Use  . Yes    Comment: occasional gin and tonic, none since moving  to     Family History  Problem Relation Age of Onset  . Alcoholism Father     died in 28s related to complciations  . Heart failure Mother   . Arrhythmia Mother     Review of Systems: The review of systems is positive for fatigue.  All other systems were reviewed and are negative.  Physical Exam: BP 118/68  Pulse 68  Ht 6\' 2"  (1.88 m)  Wt 237 lb 6.4 oz (107.684 kg)  BMI 30.47 kg/m2  SpO2 98% He is a very pleasant elderly white male in no acute distress. HEENT: Normocephalic, atraumatic. Pupils equal round and reactive to light and accommodation. Extraocular movements are full. Oropharynx is clear. Neck is without jugular venous distention, adenopathy, thyromegaly, or bruits. Lungs: Clear Cardiovascular: Regular rate and rhythm. S1 and S2. No  gallop, murmur, or click. Abdomen: Soft and nontender. No masses or bruits. Bowel sounds are positive. Extremities: No cyanosis or edema. Pulses are 2+ and symmetric. Skin: Warm and dry Neuro: Alert and oriented x3. Cranial nerves II through XII are intact. Memory is poor.  LABORATORY DATA: ECG dated 03/11/2013 shows normal sinus rhythm with LVH and QRS widening.  Echo:Study Conclusions  - Procedure narrative: Transthoracic echocardiography. Image quality was adequate. The study was technically difficult, as a result of poor acoustic windows. - Left ventricle: The cavity size was normal. Wall thickness was normal. Systolic function was normal. The estimated ejection fraction was in the range of 55% to 60%. Wall motion was normal; there were no regional wall motion abnormalities. Diastolic function is indeterminate. - Left atrium: Poorly visualized - though appears normal sized. - Systemic veins: The IVC was not visualized.    Assessment / Plan: 1. Orthostatic hypotension. He is not orthostatic today by my exam. This may be exacerbated by his use of Rapaflo. Currently he is not having significant symptomatology. If he should have more frequent symptoms associated with orthostasis then we may need to consider stopping Rapaflo. For now I would recommend  liberalizing the sodium in his diet and wearing compression stockings.  2. Fatigue. I think a lot of this is related to his Alzheimer's with behavioral changes. He is spending much of his day in bed. I do not see a cardiac cause for this. Patient is scheduled to see neurology next week for evaluation.  3. History of CVA.  4. History of DVT.  Plan: At this point I see no active cardiac issues. He has no significant ischemic symptoms. With his progressive Alzheimer's I'm not sure that I would pursue further evaluation at this point. Echocardiogram was unremarkable. I discussed this with the daughter and she is satisfied. She just wanted  to make sure that he did not have other cardiac issues at this time. I will see him back as needed.

## 2013-05-13 ENCOUNTER — Ambulatory Visit (INDEPENDENT_AMBULATORY_CARE_PROVIDER_SITE_OTHER): Payer: Medicare HMO | Admitting: Neurology

## 2013-05-13 ENCOUNTER — Encounter: Payer: Self-pay | Admitting: Neurology

## 2013-05-13 VITALS — BP 100/60 | HR 70 | Temp 97.6°F | Ht 74.0 in | Wt 238.0 lb

## 2013-05-13 DIAGNOSIS — F0280 Dementia in other diseases classified elsewhere without behavioral disturbance: Secondary | ICD-10-CM

## 2013-05-13 MED ORDER — DONEPEZIL HCL 5 MG PO TABS: 5.0000 mg | ORAL_TABLET | Freq: Every day | ORAL | Status: DC

## 2013-05-13 NOTE — Progress Notes (Signed)
NEUROLOGY CONSULTATION NOTE  Jamauri Kruzel MRN: [id number] DOB: 1933/03/10  Referring provider: Dr. Jordan Likes Primary care provider: Dr. Jordan Likes  Reason for consult:  Dementia.  HISTORY OF PRESENT ILLNESS: Willie Mayer is a 79 year old right-handed man with history of dementia, stroke, DVT, hypertension, orthostatic hypotension and depression who presents for dementia.  He is accompanied by his daughter.  He recently moved here from [place].  Records and images were personally reviewed where available.    Symptoms were first noted about a couple of years ago.  At first, he would often repeat questions.  He used to have a great sense of direction, however he began getting lost while driving.  His memory to detail used to be good.  He had an MRI of the brain performed around that time, which revealed a stroke, age indeterminant.  Over the next two years, memory had progressively worsened.  He had a neuropsychological evaluation performed on 02/12/13, which revealed impairment in multiple domains and was felt to be a neurodegenerative process, rather than a cerebrovascular event.  He saw a neurologist once this past summer, who started him on Namenda.  The next day he began having orthostatic hypotension, so some of his medications were discontinued, including the Namenda.  A month later, he had another near-syncopal event.  He is more impatient now, but otherwise he does not have any change in behavior.  He denies feeling depressed.  He does not have hallucinations or delusions.  He does not exhibit abnormal movements or parkinsonism.  Overall, he performs all ADLs, but more recently it has been a chore for family to get him to take a shower.  For the past year or so, he has had hypersomnia.  He sleeps a good 10 hours a night and feels rested in the morning.  Except he will easily feel tired and take deep sleep naps.  He had a sleep study about a year ago, which was unremarkable.  He recently moved here from  [place] to live near his daughter.  He and his wife live in an independent living facility.  He takes interest in the various activities offered, such as music concerts and volleyball.  He had trouble remembering to take his medications, but now an aide comes in daily to help administer.  He no longer drives.  He no longer manages his finances alone.    His wife has Parkinson's disease.  Over the past two weeks, he reportedly has had trouble with his left leg.  He says it is difficult to move due to pain in the anterior thigh.  He recently complained of back pain, but not presently.  He and his daughter are asking about discoloration of his left foot.  04/23/13:  TSH 1.612, B12 461, vitamin D 25-Hydroxy 26.  Recent carotid doppler with no hemodynamically significant ICA stenosis.  PAST MEDICAL HISTORY: Past Medical History  Diagnosis Date  . Dementia   . Hypertension   . History of CVA (cerebrovascular accident)   . History of DVT (deep vein thrombosis)   . History of pulmonary embolism   . Depression   . Glaucoma   . Urge incontinence   . BPH (benign prostatic hyperplasia)     s/p surgery that "didnt work"    PAST SURGICAL HISTORY: Past Surgical History  Procedure Laterality Date  . Replacement total knee bilateral    . Bph surgery      MEDICATIONS: Current Outpatient Prescriptions on File Prior to Visit  Medication Sig  Dispense Refill  . brimonidine-timolol (COMBIGAN) 0.2-0.5 % ophthalmic solution Place 1 drop into both eyes every 12 (twelve) hours.      . brinzolamide (AZOPT) 1 % ophthalmic suspension Place 1 drop into the right eye 2 (two) times daily.      . citalopram (CELEXA) 20 MG tablet Take 20 mg by mouth daily.      . clopidogrel (PLAVIX) 75 MG tablet Take 75 mg by mouth daily.      . Multiple Vitamin (MULTIVITAMIN WITH MINERALS) TABS tablet Take 1 tablet by mouth daily.      . pravastatin (PRAVACHOL) 20 MG tablet Take 20 mg by mouth daily.      . silodosin (RAPAFLO)  8 MG CAPS capsule Take 8 mg by mouth daily with breakfast.      . Vitamin D, Ergocalciferol, (DRISDOL) 50000 UNITS CAPS capsule Take 1 capsule (50,000 Units total) by mouth once a week.  8 capsule  0   No current facility-administered medications on file prior to visit.    ALLERGIES: No Known Allergies  FAMILY HISTORY: Family History  Problem Relation Age of Onset  . Alcoholism Father     died in 51s related to complciations  . Heart failure Mother   . Arrhythmia Mother     SOCIAL HISTORY: History   Social History  . Marital Status: Married    Spouse Name: N/A    Number of Children: N/A  . Years of Education: N/A   Occupational History  . Insurance     Social History Main Topics  . Smoking status: Former Smoker -- 15.00 packs/day    Quit date: 07/17/1965  . Smokeless tobacco: Never Used  . Alcohol Use: Yes     Comment: occasional gin and tonic, none since moving to [place]  . Drug Use: No  . Sexual Activity: No   Other Topics Concern  . Not on file   Social History Narrative   Recently moved from [place] where he lived with his wife who has parkinsonian dementia. They were independent but moved down here after recent fall and knowledge of dementia. Live in independent living facility and indendent in ADLs.       Currently living at [facility] living. Lives with his wife. They help with medication delivery and activities of daily life.     REVIEW OF SYSTEMS: Constitutional: No fevers, chills, or sweats, no generalized fatigue, change in appetite Eyes: No visual changes, double vision, eye pain Ear, nose and throat: No hearing loss, ear pain, nasal congestion, sore throat Cardiovascular: No chest pain, palpitations Respiratory:  No shortness of breath at rest or with exertion, wheezes GastrointestinaI: No nausea, vomiting, diarrhea, abdominal pain, fecal incontinence Genitourinary:  No dysuria, urinary retention or frequency Musculoskeletal:  No neck pain,  back pain Integumentary: No rash, pruritus, skin lesions Neurological: as above Psychiatric: No depression, insomnia, anxiety Endocrine: No palpitations, fatigue, diaphoresis, mood swings, change in appetite, change in weight, increased thirst Hematologic/Lymphatic:  No anemia, purpura, petechiae. Allergic/Immunologic: no itchy/runny eyes, nasal congestion, recent allergic reactions, rashes  PHYSICAL EXAM: Filed Vitals:   05/13/13 1520  BP: 100/60  Pulse: 70  Temp: 97.6 F (36.4 C)   General: No acute distress Head:  Normocephalic/atraumatic Neck: supple, no paraspinal tenderness, full range of motion Back: No paraspinal tenderness Left Leg: positive pedal pulse, skin warm to touch, no pain to palpation of calf or thigh.  FABER test and positive leg raise test negative. Heart: regular rate and rhythm Lungs:  Clear to auscultation bilaterally. Vascular: No carotid bruits. Neurological Exam: Mental status: alert and oriented to person, place (except county and floor), and time, speech fluent and not dysarthric, recalls 1/3 words, able to name, repeat, write and read.  Able to follow 3 step command across midline.  Able to draw intersecting pentagons and clock correctly.  MMSE 25/30. Cranial nerves: CN I: not tested CN II: pupils equal, round and reactive to light, visual fields intact, fundi unremarkable. CN III, IV, VI:  full range of motion, no nystagmus, no ptosis CN V: facial sensation intact CN VII: upper and lower face symmetric CN VIII: hearing intact CN IX, X: gag intact, uvula midline CN XI: sternocleidomastoid and trapezius muscles intact CN XII: tongue midline Bulk & Tone: normal, no fasciculations. Motor: 5/5 throughout. Sensation: endorses reduced pinprick sensation over the dorsum of left foot and medial left lower leg. Deep Tendon Reflexes: 2+ and symmetric in UEs, trace in patellars, absent in ankles, toes down Finger to nose testing: normal Heel to shin:  normal Gait: normal stride, able to walk in tandem. Romberg negative.  IMPRESSION: Alzheimer's dementia.  May also have underlying lumbar radiculopathy.  PLAN: 1.  We will try Aricept 5mg  daily for a month and then 10mg  daily thereafter if tolerated. 2.  Exercise, social interaction, brain teasers.  Try to stay stimulated during the day to prevent naps. 3.  Provide information regarding Alzheimer's support groups 4.  Offered PT for leg and back.  He says it is not too bad at this time.  I did recommend speaking with his PCP regarding concerns of foot discoloration.  Exam does not suggest any evidence for DVT. 5.  Follow up in 6 months.  60 minutes spent with patient and daughter, over 50% spent reviewing outside notes, counseling and coordinating care.  Thank you for allowing me to take part in the care of this patient.  Shon Millet, DO  CC:  Clare Gandy, MD

## 2013-05-13 NOTE — Patient Instructions (Addendum)
1.  We will start donepezil (Aricept) 5mg  daily for four weeks.  If you are tolerating the medication, then after four weeks, we will increase the dose to 10mg  daily.  Side effects include nausea, vomiting, diarrhea, vivid dreams, and muscle cramps.  Please call the clinic if you experience any of these symptoms.  2.  Stay active.  Exercise, remain social.  Participate in social activities. 3.  Read and perform brain teasers.  Do not sit all day watching TV 4.  We will provide info on Alzheimer's support groups in the area. 5.  Follow up in 6 months. 6.  Discuss with Dr. Jordan Likes regarding left foot.

## 2013-05-30 ENCOUNTER — Encounter: Payer: Self-pay | Admitting: Family Medicine

## 2013-05-30 ENCOUNTER — Ambulatory Visit (INDEPENDENT_AMBULATORY_CARE_PROVIDER_SITE_OTHER): Payer: Medicare HMO | Admitting: Family Medicine

## 2013-05-30 VITALS — BP 110/56 | HR 66 | Temp 97.8°F | Ht 74.0 in | Wt 238.5 lb

## 2013-05-30 DIAGNOSIS — H409 Unspecified glaucoma: Secondary | ICD-10-CM

## 2013-05-30 DIAGNOSIS — L989 Disorder of the skin and subcutaneous tissue, unspecified: Secondary | ICD-10-CM

## 2013-05-30 NOTE — Assessment & Plan Note (Signed)
Referral to Dermatology  - due to location on his left temporal region, it was decided to refer.  - His daughter who is the primary caregiver to him and his wife expressed that she wants to make them comfortable in furthering treatment. She doesn't want to go overboard (ex. Open heart surgery).  This was asked due to the lesion having the potential of being cancerous. She understood and was agreeable to the referral. It was also thought that it would cause Willie Mayer wife, who has an anxiety/panic problem, excess worry if it was not evaluated. Willie Mayer called two times while I was in the room evaluating Willie Mayer asking if I had checked his skin lesion.

## 2013-05-30 NOTE — Patient Instructions (Signed)
Thank you for coming in,   I am referring you to an Opthalmologist and a Dermatologist that will evaluate your eyes and skin.   Please let me know how these appointments go.     Please feel free to call with any questions or concerns at any time, at [phone]. --Dr. Jordan Likes

## 2013-05-30 NOTE — Progress Notes (Signed)
Subjective:     Patient ID: Willie Mayer., male   DOB: March 13, 1933, 79 y.o.   MRN: [id number]  HPI  Willie Mayer is a 79 yo M here for a referral to an ophthalmologist and a lesion on his head.   #Glaucoma: Has a history of glaucoma. He had been followed by an ophthalmologist in [place] prior to moving to [place].  He had scheduled appointments every 3 months. He is currently on medication. States no changes in his vision.  He thinks he was initially diagnosed 2-3 years ago.   #skin lesion on left temporal region: His wife has noticed a lesion on his left temporal region. It doesn't bother him and he doesn't notice it. It doesn't itch, ooze, or bleed. He is not a good  historian but his wife feels that it is a new lesion.   Review of Systems All other systems reviewed and otherwise normal.      Objective:   Physical Exam BP 110/56  Pulse 66  Temp(Src) 97.8 F (36.6 C) (Oral)  Ht 6\' 2"  (1.88 m)  Wt 238 lb 8 oz (108.183 kg)  BMI 30.61 kg/m2 Gen: NAD, alert, cooperative with exam HEENT:  EOMI, PERRL, irregular, poorly defined black lesion       Assessment:         Plan:

## 2013-05-30 NOTE — Assessment & Plan Note (Signed)
Referral to Opthalmology

## 2013-06-03 ENCOUNTER — Encounter: Payer: Self-pay | Admitting: Family Medicine

## 2013-06-06 ENCOUNTER — Telehealth: Payer: Self-pay | Admitting: Neurology

## 2013-06-06 NOTE — Telephone Encounter (Signed)
Please call regarding med update / Willie S.

## 2013-06-09 ENCOUNTER — Other Ambulatory Visit: Payer: Self-pay | Admitting: Neurology

## 2013-06-09 MED ORDER — DONEPEZIL HCL 10 MG PO TABS: ORAL_TABLET | ORAL | Status: DC

## 2013-06-09 NOTE — Telephone Encounter (Signed)
Left a message to return my call.

## 2013-06-09 NOTE — Telephone Encounter (Signed)
Spoke with the patient's daughter, Sedalia Muta. Refilled the Aricept as requested. She states he is having some GI issues and hope that they won't worsen with the dose increase (from 5 mg to the 10 mg daily). I asked her to call if GI sx got worse. She states she will.

## 2013-06-18 ENCOUNTER — Other Ambulatory Visit: Payer: Self-pay | Admitting: Family Medicine

## 2013-06-18 ENCOUNTER — Telehealth: Payer: Self-pay | Admitting: Family Medicine

## 2013-06-18 DIAGNOSIS — F329 Major depressive disorder, single episode, unspecified: Secondary | ICD-10-CM

## 2013-06-18 MED ORDER — CITALOPRAM HYDROBROMIDE 20 MG PO TABS: 20.0000 mg | ORAL_TABLET | Freq: Every day | ORAL | Status: DC

## 2013-06-18 NOTE — Telephone Encounter (Signed)
Refill request for Celexa. Will be completely out tomorrow. Pharmacy has sent request twice per daughter. Please refill today.

## 2013-06-25 ENCOUNTER — Telehealth: Payer: Self-pay | Admitting: Family Medicine

## 2013-06-25 ENCOUNTER — Telehealth: Payer: Self-pay | Admitting: Neurology

## 2013-06-25 NOTE — Telephone Encounter (Signed)
Returning a call from Diane. She left me a message on my vm at 2:17 pm stating the Aricept was just not agreeing with her dad; extreme fatigue, some GI distress and aggressive behavior for the past several days. She is concerned about this recent change in her dad and would like to know what Dr. Everlena Cooper thinks. She isn't really sure if this is the disease or the medication and she doesn't know how to distinguish the difference. I told her that I would let Dr. Everlena Cooper know about these issues and that I would get back with her tomorrow. She is fine to wait. **Dr. Everlena Cooper, please advise. Was taking the Aricept 10 mg.

## 2013-06-25 NOTE — Telephone Encounter (Signed)
Daughter calls to tell Dr. Jordan Likes that they have completed the weekly Vit D supplements and are ready to begin daily supplements. Also, patient now needs refills on Rapaflo and Pravastatin and would like to get 90 day supplies for thes meds.

## 2013-06-26 NOTE — Telephone Encounter (Signed)
I would stop the Aricept.  There are other medications we can try.  But before starting a new medication I would like to wait to see if symptoms improve and then we can try another medication instead.  I ask that she call us back in two weeks (Dec 23) with an update. ARJ

## 2013-06-26 NOTE — Telephone Encounter (Signed)
Spoke with Diane. Information given as per Dr. Everlena Cooper below. Will stop the Aricept and call us in a couple of weeks with an update. She was fine with this plan.

## 2013-06-30 ENCOUNTER — Other Ambulatory Visit: Payer: Self-pay | Admitting: Family Medicine

## 2013-07-02 ENCOUNTER — Other Ambulatory Visit: Payer: Self-pay | Admitting: Family Medicine

## 2013-07-02 DIAGNOSIS — E785 Hyperlipidemia, unspecified: Secondary | ICD-10-CM

## 2013-07-02 DIAGNOSIS — N400 Enlarged prostate without lower urinary tract symptoms: Secondary | ICD-10-CM

## 2013-07-02 DIAGNOSIS — E559 Vitamin D deficiency, unspecified: Secondary | ICD-10-CM

## 2013-07-02 MED ORDER — SILODOSIN 8 MG PO CAPS: 8.0000 mg | ORAL_CAPSULE | Freq: Every day | ORAL | Status: DC

## 2013-07-02 MED ORDER — PRAVASTATIN SODIUM 20 MG PO TABS: 20.0000 mg | ORAL_TABLET | Freq: Every day | ORAL | Status: DC

## 2013-07-02 MED ORDER — ERGOCALCIFEROL 10 MCG (400 UNIT) PO TABS: 400.0000 [IU] | ORAL_TABLET | Freq: Every day | ORAL | Status: DC

## 2013-07-02 NOTE — Telephone Encounter (Signed)
Daughter calls again today. Nothing has been done since her call on 06/25/13.

## 2013-07-02 NOTE — Telephone Encounter (Signed)
Refilled Rapaflo, Statin and started Ergocalciferol 400 units daily.

## 2013-07-03 NOTE — Telephone Encounter (Signed)
Meds were refilled yesterday to [facility]. per Dr. Jordan Likes.  Called and left detailed message on patient's daughter's voicemail.  Gaylene Brooks, RN

## 2013-07-09 ENCOUNTER — Other Ambulatory Visit: Payer: Self-pay | Admitting: Neurology

## 2013-07-09 MED ORDER — MEMANTINE HCL 28 X 5 MG & 21 X 10 MG PO TABS: ORAL_TABLET | ORAL | Status: DC

## 2013-07-09 NOTE — Telephone Encounter (Signed)
Spoke with Diane. Information given as per Dr. Everlena Cooper below. Instructed to call if problems/concerns. She states she will. E-scribed to the [facility] on [address].

## 2013-07-09 NOTE — Telephone Encounter (Signed)
We can retry Namenda and see how he does.  I would titrate it as so: 5mg  daily for 7 days, then 5mg  twice daily for 7 days, then  5mg  in morning and 10mg  at bedtime for 7 days, then 10mg  twice daily.  Side effects include dizziness, headache, diarrhea or constipation.  Call with any questions or concerns.

## 2013-07-09 NOTE — Telephone Encounter (Signed)
Spoke with Diane. She reports he is back to normal now that he is off the Aricept. Wondering if Dr. Everlena Cooper wants to try something else. Was on Namenda in the past but had a "passing out" episode so the MD stopped it. MD also stopped the Vesicare that he had just started and also discovered that he was dehydrated at the same time. Diane is not sure the Namenda was the cause of the episode. I told her I would let Dr. Everlena Cooper know this information and get his recommendation and we would call her back. She is fine with this plan. Pharmacy is the [facility] on [address]. **Dr. Everlena Cooper please advise.

## 2013-07-14 ENCOUNTER — Telehealth: Payer: Self-pay | Admitting: Family Medicine

## 2013-07-14 NOTE — Telephone Encounter (Signed)
Returned patient phone call in regards to the Vitamin D and interaction with one of his other medications. Looked through his medication list and couldn't find an interaction. Called the pharmacy and they stated that his medications was there to pick up. I spoke with daughter and told her she could use an other the counter Vitamin D supplement if need be.

## 2013-07-14 NOTE — Telephone Encounter (Signed)
Daughter calls, pharmacist does not agree with the daily Vitamin D supplement because it counteracts with one of his other meds (daughter did not know which med, either Celexa or Plavix). Please advise patient on what to do.

## 2013-08-14 ENCOUNTER — Telehealth: Payer: Self-pay | Admitting: Family Medicine

## 2013-08-14 DIAGNOSIS — I639 Cerebral infarction, unspecified: Secondary | ICD-10-CM

## 2013-08-14 MED ORDER — CLOPIDOGREL BISULFATE 75 MG PO TABS
75.0000 mg | ORAL_TABLET | Freq: Every day | ORAL | Status: AC
Start: 2013-08-14 — End: ?

## 2013-08-14 NOTE — Telephone Encounter (Signed)
Refill request for Plavix, 90 day supply preferably.

## 2013-08-29 ENCOUNTER — Telehealth: Payer: Self-pay | Admitting: Family Medicine

## 2013-08-29 NOTE — Telephone Encounter (Signed)
[facility] referral needed for [facility]. Appt is 4/29 with Dr. Everlena Cooper.

## 2013-08-29 NOTE — Telephone Encounter (Signed)
Spoke with [facility] and they didn't know [facility] stopped referral process x 90 days.   [place]

## 2013-09-08 ENCOUNTER — Telehealth: Payer: Self-pay | Admitting: Family Medicine

## 2013-09-08 NOTE — Telephone Encounter (Signed)
Pt is tired and doesn't want to go eat. He is breathing heavy when  He walkes Please advise

## 2013-09-09 ENCOUNTER — Ambulatory Visit (INDEPENDENT_AMBULATORY_CARE_PROVIDER_SITE_OTHER): Payer: Commercial Managed Care - HMO | Admitting: Family Medicine

## 2013-09-09 ENCOUNTER — Encounter: Payer: Self-pay | Admitting: Family Medicine

## 2013-09-09 VITALS — BP 126/74 | HR 86 | Temp 97.4°F | Wt 237.0 lb

## 2013-09-09 DIAGNOSIS — R5381 Other malaise: Secondary | ICD-10-CM | POA: Diagnosis not present

## 2013-09-09 DIAGNOSIS — R5383 Other fatigue: Principal | ICD-10-CM

## 2013-09-09 LAB — CBC
HCT: 41.3 % (ref 39.0–52.0)
Hemoglobin: 14.4 g/dL (ref 13.0–17.0)
MCH: 30.3 pg (ref 26.0–34.0)
MCHC: 34.9 g/dL (ref 30.0–36.0)
MCV: 86.9 fL (ref 78.0–100.0)
PLATELETS: 189 10*3/uL (ref 150–400)
RBC: 4.75 MIL/uL (ref 4.22–5.81)
RDW: 14.4 % (ref 11.5–15.5)
WBC: 8.9 10*3/uL (ref 4.0–10.5)

## 2013-09-09 LAB — COMPREHENSIVE METABOLIC PANEL
ALBUMIN: 4.2 g/dL (ref 3.5–5.2)
ALT: 13 U/L (ref 0–53)
AST: 20 U/L (ref 0–37)
Alkaline Phosphatase: 83 U/L (ref 39–117)
BUN: 19 mg/dL (ref 6–23)
CALCIUM: 9.8 mg/dL (ref 8.4–10.5)
CHLORIDE: 103 meq/L (ref 96–112)
CO2: 29 mEq/L (ref 19–32)
Creat: 1.38 mg/dL — ABNORMAL HIGH (ref 0.50–1.35)
Glucose, Bld: 103 mg/dL — ABNORMAL HIGH (ref 70–99)
POTASSIUM: 4.4 meq/L (ref 3.5–5.3)
Sodium: 140 mEq/L (ref 135–145)
Total Bilirubin: 1.4 mg/dL — ABNORMAL HIGH (ref 0.2–1.2)
Total Protein: 6.9 g/dL (ref 6.0–8.3)

## 2013-09-09 NOTE — Assessment & Plan Note (Signed)
Continued fatigue w/ inc sleep and apathy most likely related to Alzheimer's disease +/- Depression - Review of records revealed extensive Neg work-up for other causes: TSH, B12, Vit D, CBC, ECHO, EKG - Repeats CBC, CMP today to rule out electrolyte abnormalities or occult GI bleed given on Plavix but anticipate them being wnl - BP and pulses ox with ambulation normal today, but daughter reports decreased BP < 90/60 at nursing home  - Consider trial off Rapaflo in future - Advised pt to keep apt with Neurology in April and f/u with PCP after that - Consider switching antidepressant medication at next visit if blood work normal today

## 2013-09-09 NOTE — Patient Instructions (Signed)
It was great seeing you today.   1. I will check some labs today to evaluate your fatigue and notify you of the results.  2. I recommend keeping your appointment with the neurologist and discussing the need to continue or stop Namenda  I look forward to talking with you again at our next visit. If you have any questions or concerns before then, please call the clinic at [phone].  Take Care,   Dr Wenda Low

## 2013-09-09 NOTE — Progress Notes (Signed)
Subjective:     Patient ID: Willie Mayer., male   DOB: 06/13/1933, 80 y.o.   MRN: [id number]  HPI Comments: Willie Mayer is brought in by his daughter today for fatigue and feeling exhausted. She reports that he is sleeping most the the day and through the night, and that he doesn't have the energy to walk as far as he used to. Willie Mayer says he feels like his muscles are weak when he tries to walk or stand, but denies CP, SOB or LE. He has a Hx of smoking but not currently being treated with any respiratory medications. Daughter reports worsening symptoms since diagnosed with Alzheimer and loss of interest in his usual pleasures. He denies feeling down or depressed and continues to take Celexa which he has been on for years.     Review of Systems  Constitutional: Positive for activity change and fatigue. Negative for fever, appetite change and unexpected weight change.  Respiratory: Negative for cough, chest tightness and shortness of breath.   Cardiovascular: Negative for chest pain, palpitations and leg swelling.  Gastrointestinal: Negative for nausea, vomiting, abdominal pain, diarrhea and blood in stool.  Endocrine: Negative for cold intolerance.  Genitourinary: Negative for dysuria.  Neurological: Positive for weakness. Negative for dizziness, syncope and numbness.  Psychiatric/Behavioral: Positive for sleep disturbance. Negative for dysphoric mood.       Objective:   Physical Exam  Constitutional: He appears well-developed and well-nourished.  Cardiovascular: Normal rate and regular rhythm.   No murmur heard. Mild 1+ LE edema  Pulmonary/Chest: Effort normal and breath sounds normal. No respiratory distress. He has no wheezes.  Neurological: He is alert. No cranial nerve deficit. Coordination normal.  Skin: Skin is warm. No rash noted.   Assessment/Plan:      See Problem Focused Assessment & Plan

## 2013-09-10 ENCOUNTER — Encounter: Payer: Self-pay | Admitting: Family Medicine

## 2013-09-10 LAB — PRO B NATRIURETIC PEPTIDE: Pro B Natriuretic peptide (BNP): 283.7 pg/mL

## 2013-09-10 NOTE — Telephone Encounter (Signed)
Patient seen by Dr. Gayla Doss yesterday.

## 2013-09-19 ENCOUNTER — Telehealth: Payer: Self-pay | Admitting: Neurology

## 2013-09-19 NOTE — Telephone Encounter (Signed)
Patient care taker  Calling asking about the below medication  I advised her that he should be taking the 10 mg bid

## 2013-09-19 NOTE — Telephone Encounter (Signed)
Pt went to pharmacy to p/u Namenda refill from pharmacy. He was given another trial pack of Namenda, is this correct or should he be on a different dose? Please call [phone]. Ok to [facility]. Uses [facility] on [address] / [place]

## 2013-11-12 ENCOUNTER — Ambulatory Visit (INDEPENDENT_AMBULATORY_CARE_PROVIDER_SITE_OTHER): Payer: Medicare HMO | Admitting: Neurology

## 2013-11-12 ENCOUNTER — Encounter: Payer: Self-pay | Admitting: Neurology

## 2013-11-12 VITALS — BP 112/60 | HR 68 | Resp 18 | Ht 74.0 in | Wt 242.0 lb

## 2013-11-12 DIAGNOSIS — G309 Alzheimer's disease, unspecified: Principal | ICD-10-CM

## 2013-11-12 DIAGNOSIS — F0280 Dementia in other diseases classified elsewhere without behavioral disturbance: Secondary | ICD-10-CM | POA: Insufficient documentation

## 2013-11-12 NOTE — Progress Notes (Signed)
NEUROLOGY FOLLOW UP OFFICE NOTE  Willie Mayer [id number]  HISTORY OF PRESENT ILLNESS: Willie Mayer is a 80 year old right-handed man with history of dementia, stroke, DVT, hypertension, orthostatic hypotension and depression who follows up for Alzheimer's dementia.  He is accompanied by his daughter.  He recently moved here from [place].  Records and images were personally reviewed where available.    UPDATE: He did not tolerate Aricept.  It made him fatigued, gave him GI distress, and caused aggressive behavior.  He was started on Namenda and is tolerating it well.  He continues to live at the independent living facility with his wife. They now have 24 hour home care because of his wife's disability regarding her Parkinson's disease. He is still able to perform his activities of daily living, however he needs prompting to perform some of these tasks. For example, he has not been very agreeable to taking a shower. He tends to make excuses. He is very good about brushing his teeth, shaving, and dressing. He does wear diapers and is typically able to clean himself in the bathroom. However, he did wet the bed one night. Also, he once put the dirty diaper in the closet. Initially, it was difficult for him to get out of bed. He would sleep late. It became more difficult for him to get up and eat breakfast. It was determined that he may have had disrupted sleep due to his wife, who has problems with sleeping. He recently started sleeping in a separate room and has been much more awake and alert during the day and getting out of bed earlier. He sometimes needs prompting to eat, but overall he he has a good appetite. For lunch and dinner he he and his wife will go he in the common dining room. Normally, he will speak with the caregiver and his wife that occasionally he will converse with the other residents. He usually sits around during the day either watching TV or taking a nap. He doesn't get much  physical activity. Often when he stands up from his chair, he has to get on his knees and push himself by pushing his arms on the side table. He has not irritable, agitated, or combative. He does not have hallucinations or delusions. He may feel a little down but he endorses no depression.  Caregiver administers his medications.  HISTORY: Symptoms were first noted about a couple of years ago.  At first, he would often repeat questions.  He used to have a great sense of direction, however he began getting lost while driving.  His memory to detail used to be good.  He had an MRI of the brain performed around that time, which revealed a stroke, age indeterminant.  Over the next two years, memory had progressively worsened.  He had a neuropsychological evaluation performed on 02/12/13, which revealed impairment in multiple domains and was felt to be a neurodegenerative process, rather than a cerebrovascular event.  He saw a neurologist once this past summer, who started him on Namenda.  The next day he began having orthostatic hypotension, so some of his medications were discontinued, including the Namenda.  A month later, he had another near-syncopal event.  He is more impatient now, but otherwise he does not have any change in behavior.  He denies feeling depressed.  He does not have hallucinations or delusions.  He does not exhibit abnormal movements or parkinsonism.  Overall, he performs all ADLs, but more recently it has  been a chore for family to get him to take a shower.  For the past year or so, he has had hypersomnia.  He sleeps a good 10 hours a night and feels rested in the morning.  Except he will easily feel tired and take deep sleep naps.  He had a sleep study about a year ago, which was unremarkable.  He recently moved here from [place] to live near his daughter.  He and his wife live in an independent living facility.  He takes interest in the various activities offered, such as music concerts and  volleyball.  He had trouble remembering to take his medications, but now an aide comes in daily to help administer.  He no longer drives.  He no longer manages his finances alone.    04/23/13:  TSH 1.612, B12 461, vitamin D 25-Hydroxy 26.  Recent carotid doppler with no hemodynamically significant ICA stenosis.  05/13/13 MMSE:  oriented to place except county and floor, recalled 1 of 3 words, language intact, able to draw intersecting pentagons and clock, 25/30.  PAST MEDICAL HISTORY: Past Medical History  Diagnosis Date  . Dementia   . Hypertension   . History of CVA (cerebrovascular accident)   . History of DVT (deep vein thrombosis)   . History of pulmonary embolism   . Depression   . Glaucoma   . Urge incontinence   . BPH (benign prostatic hyperplasia)     s/p surgery that "didnt work"    MEDICATIONS: Current Outpatient Prescriptions on File Prior to Visit  Medication Sig Dispense Refill  . brimonidine-timolol (COMBIGAN) 0.2-0.5 % ophthalmic solution Place 1 drop into both eyes every 12 (twelve) hours.      . brinzolamide (AZOPT) 1 % ophthalmic suspension Place 1 drop into the right eye 2 (two) times daily.      . citalopram (CELEXA) 20 MG tablet Take 1 tablet (20 mg total) by mouth daily.  90 tablet  1  . clopidogrel (PLAVIX) 75 MG tablet Take 1 tablet (75 mg total) by mouth daily.  90 tablet  3  . Ergocalciferol 400 UNITS TABS Take 400 Units by mouth daily.  90 tablet  3  . Multiple Vitamin (MULTIVITAMIN WITH MINERALS) TABS tablet Take 1 tablet by mouth daily.      . pravastatin (PRAVACHOL) 20 MG tablet Take 1 tablet (20 mg total) by mouth daily.  90 tablet  3  . silodosin (RAPAFLO) 8 MG CAPS capsule Take 1 capsule (8 mg total) by mouth daily with breakfast.  90 capsule  3   No current facility-administered medications on file prior to visit.    ALLERGIES: No Known Allergies  FAMILY HISTORY: Family History  Problem Relation Age of Onset  . Alcoholism Father     died in  3870s related to complciations  . Heart failure Mother   . Arrhythmia Mother     SOCIAL HISTORY: History   Social History  . Marital Status: Married    Spouse Name: N/A    Number of Children: N/A  . Years of Education: N/A   Occupational History  . Insurance     Social History Main Topics  . Smoking status: Former Smoker -- 15.00 packs/day    Quit date: 07/17/1965  . Smokeless tobacco: Never Used  . Alcohol Use: Yes     Comment: occasional gin and tonic, none since moving to [place]  . Drug Use: No  . Sexual Activity: No   Other Topics Concern  . Not  on file   Social History Narrative   Recently moved from [place] where he lived with his wife who has parkinsonian dementia. They were independent but moved down here after recent fall and knowledge of dementia. Live in independent living facility and indendent in ADLs.       Currently living at [facility] living. Lives with his wife. They help with medication delivery and activities of daily life.     REVIEW OF SYSTEMS: Constitutional: some fatigue Eyes: No visual changes, double vision, eye pain Ear, nose and throat: No hearing loss, ear pain, nasal congestion, sore throat Cardiovascular: No chest pain, palpitations Respiratory:  No shortness of breath at rest or with exertion, wheezes GastrointestinaI: No nausea, vomiting, diarrhea, abdominal pain, fecal incontinence Genitourinary:  Urinary incontinence Musculoskeletal:  No neck pain, back pain Integumentary: No rash, pruritus, skin lesions Neurological: as above Psychiatric: No depression, insomnia, anxiety Endocrine: No palpitations, fatigue, diaphoresis, mood swings, change in appetite, change in weight, increased thirst Hematologic/Lymphatic:  No anemia, purpura, petechiae. Allergic/Immunologic: no itchy/runny eyes, nasal congestion, recent allergic reactions, rashes  PHYSICAL EXAM: Filed Vitals:   11/12/13 1531  BP: 112/60  Pulse: 68  Resp: 18   General:  No acute distress Head:  Normocephalic/atraumatic Neck: supple, no paraspinal tenderness, full range of motion Heart:  Regular rate and rhythm Lungs:  Clear to auscultation bilaterally Back: No paraspinal tenderness Neurological Exam: Alert.  Attention span and concentration intact, fund of knowledge somewhat impaired.  Not aware of current events.  Does not know the name of the president.  Oriented to year, season and month (not date or day of week).  Oriented to place except for floor.  Delayed recall poor (recalled 0 of 3 words).  Serial 7 subtraction intact.  Able to name, write, read, repeat and follow 3 step command across midline.  Able to copy intersecting pentagons and draw a clock correctly to requested time.  MMSE 24/30.  Speech fluent and not dysarthric.  CN II-XII intact. Fundoscopic exam unremarkable without vessel changes, exudates, hemorrhages or papilledema.  Bulk and tone normal, muscle strength 5/5 throughout.  Sensation to pinprick and vibration reduced in feet.  Deep tendon reflexes 2+ throughout, toes downgoing.  Finger to nose intact.  Used arms to push self out of chair but legs seemed strong.  Gait normal, Romberg negative.  IMPRESSION: Alzheimer's disease  PLAN: 1.  Continue Namenda 2.  Recommend setting routine schedule, including bedtime, getting up, washing, eating, and exercising. 3.  PT evaluation 4.  24 hour care.  30 minutes spent with patient and daughter, over 50% spent counseling and coordinating care.  Shon Millet, DO  CC:  Clare Gandy, MD

## 2013-11-12 NOTE — Patient Instructions (Signed)
1.  Continue the Namenda 10mg  twice daily. 2.  Recommend keeping a routine schedule to follow everyday.  He should go to bed and get up at same time daily.  I agree that he sleeps in a separate room to help him sleep.  He should participate in exercise classes on a routine basis.  He should be expected to perform daily routine activities such as washing up and eating at the same time. 3.  Refer to physical therapy 4.  Follow up in 6 months.

## 2013-11-13 ENCOUNTER — Other Ambulatory Visit: Payer: Self-pay | Admitting: *Deleted

## 2013-11-13 DIAGNOSIS — F0391 Unspecified dementia with behavioral disturbance: Secondary | ICD-10-CM

## 2013-11-13 DIAGNOSIS — G309 Alzheimer's disease, unspecified: Secondary | ICD-10-CM

## 2013-11-13 DIAGNOSIS — F03918 Unspecified dementia, unspecified severity, with other behavioral disturbance: Secondary | ICD-10-CM

## 2013-11-13 DIAGNOSIS — F0280 Dementia in other diseases classified elsewhere without behavioral disturbance: Secondary | ICD-10-CM

## 2013-11-14 ENCOUNTER — Telehealth: Payer: Self-pay | Admitting: *Deleted

## 2013-11-14 NOTE — Telephone Encounter (Signed)
Ref to [facility] has been made for PT  Notes and order faxed to [facility] .

## 2013-12-14 ENCOUNTER — Other Ambulatory Visit: Payer: Self-pay | Admitting: Family Medicine

## 2013-12-23 ENCOUNTER — Telehealth: Payer: Self-pay | Admitting: Family Medicine

## 2013-12-23 NOTE — Telephone Encounter (Signed)
Patient has been on celexa for a duration of time. Contemplating switching or adding medication to see if there may be any improvement.

## 2013-12-25 NOTE — Telephone Encounter (Signed)
Spoke with patient's daughter and she states that no medication is needed at the moment

## 2013-12-25 NOTE — Telephone Encounter (Signed)
Daughter returned call and will await your call to her.

## 2013-12-25 NOTE — Telephone Encounter (Signed)
LVM for patient's daughter to call back.  ?

## 2014-02-11 ENCOUNTER — Telehealth: Payer: Self-pay | Admitting: Neurology

## 2014-02-11 NOTE — Telephone Encounter (Signed)
Pt's daughter called regarding progression of pt's dementia. Please call back @ [phone] / Sherr S.

## 2014-02-11 NOTE — Telephone Encounter (Signed)
Patients daughter called and stated her father who is in a ALF has begun to make sexual advances at the nurses . The nursing staff  has requested  A RX for Risperdal  To calm him down a bit . I advised patients daughter to have the staff to use redirection with his behavior .Please advise

## 2014-02-11 NOTE — Telephone Encounter (Signed)
I agree.  Unless he is combative or possible harm to self or others, I wouldn't add an antipsychotic as the side effects would likely outweigh the benefits.

## 2014-05-13 ENCOUNTER — Ambulatory Visit (INDEPENDENT_AMBULATORY_CARE_PROVIDER_SITE_OTHER): Payer: Commercial Managed Care - HMO | Admitting: Neurology

## 2014-05-13 ENCOUNTER — Encounter: Payer: Self-pay | Admitting: Neurology

## 2014-05-13 VITALS — BP 118/64 | HR 68 | Temp 97.7°F | Resp 18 | Wt 243.2 lb

## 2014-05-13 DIAGNOSIS — I639 Cerebral infarction, unspecified: Secondary | ICD-10-CM

## 2014-05-13 DIAGNOSIS — F0280 Dementia in other diseases classified elsewhere without behavioral disturbance: Secondary | ICD-10-CM

## 2014-05-13 DIAGNOSIS — G309 Alzheimer's disease, unspecified: Secondary | ICD-10-CM

## 2014-05-13 NOTE — Patient Instructions (Signed)
I think you may have had a stroke over the past few months.  I would continue current management with the Plavix and cholesterol management.  I recommend trying not to stay in the apartment too often so you don't nap all day.  Follow up in 6 months or as needed.

## 2014-05-13 NOTE — Progress Notes (Signed)
NEUROLOGY FOLLOW UP OFFICE NOTE  Willie Jerseyhomas H Hollifield Jr. [id number]  HISTORY OF PRESENT ILLNESS: Willie Mayer is a 80 year old right-handed man with history of dementia, stroke, DVT, hypertension, orthostatic hypotension and depression who follows up for Alzheimer's dementia.  He is accompanied by his daughter.     UPDATE: He is taking Namenda and is tolerating it well.  There has been a gradual but still fairly quick decline since last visit.   He reportedly was making sexual advances towards the nurses.  He is not combative and is not a harm to himself or others.  He has been napping more often in bed.  He will usually have breakfast brought to him in bed.  He sometimes needs prompting to eat, but overall he he has a good appetite. For lunch and dinner he and his wife will go he in the common dining room. He doesn't get much physical activity.  When he was in PT, it was noted that he was dragging his right foot and a stroke was suspected.  He has had further decline in memory.  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  He has trouble recognizing names but not faces.  He lives in independent living facility with his wife. They have 24 hour home care because of his wife's disability regarding her Parkinson's disease. He is still able to perform his activities of daily living, however he needs prompting to perform some of these tasks. For example, he has not been very agreeable to taking a shower. He tends to make excuses. He is very good about brushing his teeth, shaving, and dressing. He does wear diapers and is typically able to clean himself in the bathroom. However, he did wet the bed one night.  Often when he stands up from his chair, he has to get on his knees and push himself by pushing his arms on the side table. He has not irritable, agitated, or combative. He does not have hallucinations or delusions. He may feel a little down but he endorses no depression.  Caregiver administers his  medications.  Usually he is agreeable, but sometimes refuses to take them.   Labs from 12/19/13:  B12 795, LDL 78, TSH 2.68   HISTORY: Symptoms were first noted about a couple of years ago.  At first, he would often repeat questions.  He used to have a great sense of direction, however he began getting lost while driving.  His memory to detail used to be good.  He had an MRI of the brain performed around that time, which revealed a stroke, age indeterminant.  Over the next two years, memory had progressively worsened.  He had a neuropsychological evaluation performed on 02/12/13, which revealed impairment in multiple domains and was felt to be a neurodegenerative process, rather than a cerebrovascular event.  He saw a neurologist who started him on Namenda.  The next day he began having orthostatic hypotension, so some of his medications were discontinued, including the Namenda.  A month later, he had another near-syncopal event.  He is more impatient now, but otherwise he does not have any change in behavior.  He denies feeling depressed.  He does not have hallucinations or delusions.  He does not exhibit abnormal movements or parkinsonism.  Overall, he performs all ADLs, but more recently it has been a chore for family to get him to take a shower.  For the past year or so, he has had hypersomnia.  He sleeps  a good 10 hours a night and feels rested in the morning.  Except he will easily feel tired and take deep sleep naps.  He had a sleep study about a year ago, which was unremarkable.  He recently moved here from [place] to live near his daughter.  He and his wife live in an independent living facility.  He takes interest in the various activities offered, such as music concerts and volleyball.  He had trouble remembering to take his medications, but now an aide comes in daily to help administer.  He no longer drives.  He no longer manages his finances alone.  He has had 3 falls since moving down here.  He  did not tolerate Aricept.  It made him fatigued, gave him GI distress, and caused aggressive behavior.  He was restarted on Namenda  11/12/13 MMSE 24/30. 04/23/13:  TSH 1.612, B12 461, vitamin D 25-Hydroxy 26.  Recent carotid doppler with no hemodynamically significant ICA stenosis.  PAST MEDICAL HISTORY: Past Medical History  Diagnosis Date  . Dementia   . Hypertension   . History of CVA (cerebrovascular accident)   . History of DVT (deep vein thrombosis)   . History of pulmonary embolism   . Depression   . Glaucoma   . Urge incontinence   . BPH (benign prostatic hyperplasia)     s/p surgery that "didnt work"    MEDICATIONS: Current Outpatient Prescriptions on File Prior to Visit  Medication Sig Dispense Refill  . brimonidine-timolol (COMBIGAN) 0.2-0.5 % ophthalmic solution Place 1 drop into both eyes every 12 (twelve) hours.      . brinzolamide (AZOPT) 1 % ophthalmic suspension Place 1 drop into the right eye 2 (two) times daily.      . citalopram (CELEXA) 20 MG tablet Take 1 tablet (20 mg total) by mouth daily.  90 tablet  1  . clopidogrel (PLAVIX) 75 MG tablet Take 1 tablet (75 mg total) by mouth daily.  90 tablet  3  . Ergocalciferol 400 UNITS TABS Take 400 Units by mouth daily.  90 tablet  3  . memantine (NAMENDA) 10 MG tablet Take 10 mg by mouth 2 (two) times daily.      . Multiple Vitamin (MULTIVITAMIN WITH MINERALS) TABS tablet Take 1 tablet by mouth daily.      . pravastatin (PRAVACHOL) 20 MG tablet Take 1 tablet (20 mg total) by mouth daily.  90 tablet  3  . silodosin (RAPAFLO) 8 MG CAPS capsule Take 1 capsule (8 mg total) by mouth daily with breakfast.  90 capsule  3   No current facility-administered medications on file prior to visit.    ALLERGIES: No Known Allergies  FAMILY HISTORY: Family History  Problem Relation Age of Onset  . Alcoholism Father     died in 61s related to complciations  . Heart failure Mother   . Arrhythmia Mother     SOCIAL  HISTORY: History   Social History  . Marital Status: Married    Spouse Name: N/A    Number of Children: N/A  . Years of Education: N/A   Occupational History  . Insurance     Social History Main Topics  . Smoking status: Former Smoker -- 15.00 packs/day    Quit date: 07/17/1965  . Smokeless tobacco: Never Used  . Alcohol Use: Yes     Comment: occasional gin and tonic, none since moving to [place]  . Drug Use: No  . Sexual Activity: No   Other Topics  Concern  . Not on file   Social History Narrative   Recently moved from [place] where he lived with his wife who has parkinsonian dementia. They were independent but moved down here after recent fall and knowledge of dementia. Live in independent living facility and indendent in ADLs.       Currently living at [facility] living. Lives with his wife. They help with medication delivery and activities of daily life.     REVIEW OF SYSTEMS: Constitutional: No fevers, chills, or sweats, no generalized fatigue, change in appetite Eyes: No visual changes, double vision, eye pain Ear, nose and throat: No hearing loss, ear pain, nasal congestion, sore throat Cardiovascular: No chest pain, palpitations Respiratory:  No shortness of breath at rest or with exertion, wheezes GastrointestinaI: No nausea, vomiting, diarrhea, abdominal pain, fecal incontinence Genitourinary:  No dysuria, urinary retention or frequency Musculoskeletal:  No neck pain, back pain Integumentary: No rash, pruritus, skin lesions Neurological: as above Psychiatric: No depression, insomnia, anxiety Endocrine: No palpitations, fatigue, diaphoresis, mood swings, change in appetite, change in weight, increased thirst Hematologic/Lymphatic:  No anemia, purpura, petechiae. Allergic/Immunologic: no itchy/runny eyes, nasal congestion, recent allergic reactions, rashes  PHYSICAL EXAM: Filed Vitals:   05/13/14 1553  BP: 118/64  Pulse: 68  Temp: 97.7 F (36.5 C)   Resp: 18   General: No acute distress Head:  Normocephalic/atraumatic Neck: supple, no paraspinal tenderness, full range of motion Heart:  Regular rate and rhythm Lungs:  Clear to auscultation bilaterally Back: No paraspinal tenderness Neurological Exam: Alert.  Attention span and concentration intact, fund of knowledge somewhat impaired.  Impairment in delayed recall and some remote memory.  Speech fluent and not dysarthric.   MMSE - Mini Mental State Exam 05/13/2014  Orientation to time 0  Orientation to Place 2  Registration 3  Attention/ Calculation 3  Recall 0  Language- name 2 objects 2  Language- repeat 1  Language- follow 3 step command 3  Language- read & follow direction 1  Write a sentence 1  Copy design 1  Total score 17  CN II-XII intact. Fundoscopic exam unremarkable without vessel changes, exudates, hemorrhages or papilledema.  Bulk and tone normal, muscle strength 5/5 throughout, but reduced finger-thumb tapping speed and amplitude on the right.  Sensation to pinprick and vibration reduced in left hand and leg.  Deep tendon reflexes 2+ throughout, toes downgoing.  Finger to nose intact.  Used arms to push self out of chair but legs seemed strong.  Gait with dragging right leg a bit. Romberg with sway.  IMPRESSION: Alzheimer's dementia Probable CVA given subtle right sided weakness and worsening cognitive performance.  PLAN: 1.  Continue Plavix and statin (LDL at goal of less than 100).  I wouldn't perform other tests such as 2D Echo or carotid dopplers.  Testing a year ago was unremarkable and it wouldn't change management as I don't feel he is a good candidate for anticoagulation (he is a fall risk) nor candidate for carotid surgery. 2.  Namenda 10mg  twice daily 3.  Encourage to stay out of the apartment during the day to prevent desire to go to bed and sleep 4.  Follow up in 6 months or as needed.  Shon Millet, DO  CC:  Juluis Rainier, MD

## 2014-06-16 ENCOUNTER — Telehealth: Payer: Self-pay | Admitting: Neurology

## 2014-06-16 ENCOUNTER — Telehealth: Payer: Self-pay | Admitting: *Deleted

## 2014-06-16 NOTE — Telephone Encounter (Signed)
Diane, pt's daughter called wanting to speak to a nurse regarding pt's current stage. Please call pt's daughter # [phone]

## 2014-06-16 NOTE — Telephone Encounter (Signed)
Daughter called stating that patient has had a day of arguing with his spouse for no reason almost falling down in the bedroom  Going back to bed at noon and state he dreamed about frankenstein 2 different times and it scarred him really bad he wanted to know which Dr he needs to see about this ? The daughter ask is this the alzheimer's disease or could it be another CVA ? Please advise

## 2014-06-27 ENCOUNTER — Other Ambulatory Visit: Payer: Self-pay | Admitting: Family Medicine

## 2014-08-01 ENCOUNTER — Other Ambulatory Visit: Payer: Self-pay | Admitting: Neurology

## 2014-08-01 ENCOUNTER — Other Ambulatory Visit: Payer: Self-pay | Admitting: Family Medicine

## 2014-09-05 ENCOUNTER — Other Ambulatory Visit: Payer: Self-pay | Admitting: Neurology

## 2014-10-12 ENCOUNTER — Other Ambulatory Visit: Payer: Self-pay | Admitting: Neurology

## 2014-11-05 ENCOUNTER — Other Ambulatory Visit: Payer: Self-pay | Admitting: Neurology

## 2014-11-16 ENCOUNTER — Ambulatory Visit: Payer: Commercial Managed Care - HMO | Admitting: Neurology

## 2014-12-07 ENCOUNTER — Ambulatory Visit: Payer: Commercial Managed Care - HMO | Admitting: Neurology

## 2014-12-11 ENCOUNTER — Other Ambulatory Visit: Payer: Self-pay | Admitting: Neurology

## 2015-01-02 ENCOUNTER — Other Ambulatory Visit: Payer: Self-pay | Admitting: Neurology

## 2015-01-04 ENCOUNTER — Emergency Department (HOSPITAL_COMMUNITY)
Admission: EM | Admit: 2015-01-04 | Discharge: 2015-01-04 | Disposition: A | Discharge status: Home / Self Care | Payer: Commercial Managed Care - HMO | Attending: Emergency Medicine | Admitting: Emergency Medicine

## 2015-01-04 ENCOUNTER — Encounter (HOSPITAL_COMMUNITY): Payer: Self-pay

## 2015-01-04 ENCOUNTER — Emergency Department (HOSPITAL_COMMUNITY): Payer: Commercial Managed Care - HMO

## 2015-01-04 DIAGNOSIS — R531 Weakness: Secondary | ICD-10-CM | POA: Insufficient documentation

## 2015-01-04 DIAGNOSIS — Z8673 Personal history of transient ischemic attack (TIA), and cerebral infarction without residual deficits: Secondary | ICD-10-CM | POA: Diagnosis not present

## 2015-01-04 DIAGNOSIS — Z86718 Personal history of other venous thrombosis and embolism: Secondary | ICD-10-CM | POA: Insufficient documentation

## 2015-01-04 DIAGNOSIS — F0390 Unspecified dementia without behavioral disturbance: Secondary | ICD-10-CM | POA: Diagnosis not present

## 2015-01-04 DIAGNOSIS — F329 Major depressive disorder, single episode, unspecified: Secondary | ICD-10-CM | POA: Diagnosis not present

## 2015-01-04 DIAGNOSIS — Z86711 Personal history of pulmonary embolism: Secondary | ICD-10-CM | POA: Diagnosis not present

## 2015-01-04 DIAGNOSIS — H409 Unspecified glaucoma: Secondary | ICD-10-CM | POA: Insufficient documentation

## 2015-01-04 DIAGNOSIS — N400 Enlarged prostate without lower urinary tract symptoms: Secondary | ICD-10-CM | POA: Insufficient documentation

## 2015-01-04 DIAGNOSIS — Z79899 Other long term (current) drug therapy: Secondary | ICD-10-CM | POA: Insufficient documentation

## 2015-01-04 DIAGNOSIS — R0602 Shortness of breath: Secondary | ICD-10-CM | POA: Diagnosis present

## 2015-01-04 DIAGNOSIS — Z87891 Personal history of nicotine dependence: Secondary | ICD-10-CM | POA: Insufficient documentation

## 2015-01-04 LAB — URINALYSIS, ROUTINE W REFLEX MICROSCOPIC
Glucose, UA: NEGATIVE mg/dL
Hgb urine dipstick: NEGATIVE
Ketones, ur: NEGATIVE mg/dL
NITRITE: NEGATIVE
PH: 5 (ref 5.0–8.0)
Protein, ur: NEGATIVE mg/dL
SPECIFIC GRAVITY, URINE: 1.028 (ref 1.005–1.030)
Urobilinogen, UA: 1 mg/dL (ref 0.0–1.0)

## 2015-01-04 LAB — I-STAT TROPONIN, ED: TROPONIN I, POC: 0.02 ng/mL (ref 0.00–0.08)

## 2015-01-04 LAB — URINE MICROSCOPIC-ADD ON

## 2015-01-04 LAB — BASIC METABOLIC PANEL
ANION GAP: 8 (ref 5–15)
BUN: 22 mg/dL — ABNORMAL HIGH (ref 6–20)
CALCIUM: 9.3 mg/dL (ref 8.9–10.3)
CO2: 25 mmol/L (ref 22–32)
Chloride: 104 mmol/L (ref 101–111)
Creatinine, Ser: 1.52 mg/dL — ABNORMAL HIGH (ref 0.61–1.24)
GFR calc Af Amer: 48 mL/min — ABNORMAL LOW (ref >60)
GFR, EST NON AFRICAN AMERICAN: 42 mL/min — AB (ref >60)
Glucose, Bld: 136 mg/dL — ABNORMAL HIGH (ref 65–99)
Potassium: 4.5 mmol/L (ref 3.5–5.1)
SODIUM: 137 mmol/L (ref 135–145)

## 2015-01-04 LAB — CBC
HEMATOCRIT: 45.7 % (ref 39.0–52.0)
Hemoglobin: 15.3 g/dL (ref 13.0–17.0)
MCH: 30.2 pg (ref 26.0–34.0)
MCHC: 33.5 g/dL (ref 30.0–36.0)
MCV: 90.1 fL (ref 78.0–100.0)
Platelets: 211 10*3/uL (ref 150–400)
RBC: 5.07 MIL/uL (ref 4.22–5.81)
RDW: 13 % (ref 11.5–15.5)
WBC: 8.5 10*3/uL (ref 4.0–10.5)

## 2015-01-04 MED ORDER — SODIUM CHLORIDE 0.9 % IV BOLUS (SEPSIS)
500.0000 mL | Freq: Once | INTRAVENOUS | Status: AC
  Administered 2015-01-04: 500 mL via INTRAVENOUS

## 2015-01-04 NOTE — ED Notes (Signed)
Pt ambulated into hallway unassisted with 97 02

## 2015-01-04 NOTE — ED Notes (Signed)
Informed the pt a urine specimen is needed.  Pt stated that he does not have to urinate.

## 2015-01-04 NOTE — Discharge Instructions (Signed)

## 2015-01-04 NOTE — Care Management Note (Addendum)
Case Management Note  Patient Details  Name: Willie Mayer. MRN: [id number] Date of Birth: 10/13/1932  Subjective/Objective:   Patient presents to Ed with shortness of breath and wekness                 Action/Plan: Discussed home health services at bedside with patient and his family.   Expected Discharge Date:                  Expected Discharge Plan:  Home w Home Health Services  In-House Referral:     Discharge planning Services  CM Consult  Post Acute Care Choice:  Home Health Choice offered to:  Adult Children  DME Arranged:    DME Agency:     HH Arranged:  RN, Disease Management, PT, OT HH Agency:  [facility]  Status of Service:     Medicare Important Message Given:    Date Medicare IM Given:    Medicare IM give by:    Date Additional Medicare IM Given:    Additional Medicare Important Message give by:     If discussed at Long Length of Stay Meetings, dates discussed:    Additional Comments: Patient lives at Independent living facility [facility].  Patient lives at home with his wife who has severe Parkinson's disease and in a wheelchair.  Patient's daughter Georgina Quint [phone] reports patient has 24 hour care between [facility] and private duty.  Patient reports he is able to complete ADL's on his own and ambulate without difficulty, however, patient's daughter feels he cannot.  Patient has a wheelchair, cane and walker at home.  He also has grab rails in the bathroom and patient's daughter is unsure if patient has an elevated toilet seat, but she refused EDCM to order one.  Patient's daughter does not want patient to go to higher level of care at this time.  Patient's daughter confirms patient's pcp is Dr. Juluis Rainier.  EDCM provided patient's daughter with list of home health agencies in [facility] of which [facility] was chosen since the patient has had services with them in the past.  Confirmed patient's address with patient's  daughter.  Discussed patient with EDP who will place orders for RN for disease management, PT, and OT.  Patient and patient's family thankful for services.  No further EDCM needs at this time.  Home health orders faxed to [facility] at 2152pm with confirmation of receipt at 2154pm.  Radford Pax, RN 01/04/2015, 8:46 PM

## 2015-01-04 NOTE — ED Provider Notes (Signed)
CSN: [id number]     Arrival date & time 01/04/15  1702 History   First MD Initiated Contact with Patient 01/04/15 1832     Chief Complaint  Patient presents with  . Shortness of Breath  . Weakness     (Consider location/radiation/quality/duration/timing/severity/associated sxs/prior Treatment) HPI Comments: Patient presents to the ER for evaluation of progressively worsening weakness, inability to ambulate, shortness of breath. Symptoms apparently have been developing over a period of months. Patient went to primary care doctor's office today for routine follow-up was found to be hypotensive, referred to the emergency department for further evaluation.  Patient reportedly has had a slow decline over several months. He no longer ambulance more than across the room, often has to be moved around in a wheelchair. He is not experiencing any chest pain or shortness of breath currently. He reports that he feels fine when he is sitting or lying down, symptoms occur with exertion.  Patient is a 81 y.o. male presenting with shortness of breath and weakness.  Shortness of Breath Weakness Associated symptoms include shortness of breath.    Past Medical History  Diagnosis Date  . Dementia   . Hypertension   . History of CVA (cerebrovascular accident)   . History of DVT (deep vein thrombosis)   . History of pulmonary embolism   . Depression   . Glaucoma   . Urge incontinence   . BPH (benign prostatic hyperplasia)     s/p surgery that "didnt work"   Past Surgical History  Procedure Laterality Date  . Replacement total knee bilateral    . Bph surgery     Family History  Problem Relation Age of Onset  . Alcoholism Father     died in 81s related to complciations  . Heart failure Mother   . Arrhythmia Mother    History  Substance Use Topics  . Smoking status: Former Smoker -- 15.00 packs/day    Quit date: 07/17/1965  . Smokeless tobacco: Never Used  . Alcohol Use: Yes     Comment:  occasional gin and tonic    Review of Systems  Respiratory: Positive for shortness of breath.   Neurological: Positive for weakness.  All other systems reviewed and are negative.     Allergies  Review of patient's allergies indicates no known allergies.  Home Medications   Prior to Admission medications   Medication Sig Start Date End Date Taking? Authorizing Provider  brimonidine-timolol (COMBIGAN) 0.2-0.5 % ophthalmic solution Place 1 drop into both eyes every 12 (twelve) hours.    Historical Provider, MD  brinzolamide (AZOPT) 1 % ophthalmic suspension Place 1 drop into the right eye 2 (two) times daily.    Historical Provider, MD  citalopram (CELEXA) 20 MG tablet Take 1 tablet (20 mg total) by mouth daily. 06/18/13   Myra Rude, MD  clopidogrel (PLAVIX) 75 MG tablet Take 1 tablet (75 mg total) by mouth daily. 08/14/13   Myra Rude, MD  Ergocalciferol 400 UNITS TABS Take 400 Units by mouth daily. 07/02/13   Myra Rude, MD  memantine (NAMENDA) 10 MG tablet TAKE ONE TABLET BY MOUTH TWICE DAILY 01/03/15   Drema Dallas, DO  mirabegron ER (MYRBETRIQ) 50 MG TB24 tablet Take 50 mg by mouth daily.    Historical Provider, MD  Multiple Vitamin (MULTIVITAMIN WITH MINERALS) TABS tablet Take 1 tablet by mouth daily.    Historical Provider, MD  pravastatin (PRAVACHOL) 20 MG tablet TAKE ONE TABLET BY MOUTH ONCE DAILY 06/30/14  Myra Rude, MD  silodosin (RAPAFLO) 8 MG CAPS capsule Take 1 capsule (8 mg total) by mouth daily with breakfast. 07/02/13   Myra Rude, MD   BP 121/65 mmHg  Pulse 85  Temp(Src) 97.7 F (36.5 C) (Oral)  Resp 18  SpO2 95% Physical Exam  Constitutional: He is oriented to person, place, and time. He appears well-developed and well-nourished. No distress.  HENT:  Head: Normocephalic and atraumatic.  Right Ear: Hearing normal.  Left Ear: Hearing normal.  Nose: Nose normal.  Mouth/Throat: Oropharynx is clear and moist and mucous membranes are  normal.  Eyes: Conjunctivae and EOM are normal. Pupils are equal, round, and reactive to light.  Neck: Normal range of motion. Neck supple.  Cardiovascular: Regular rhythm, S1 normal and S2 normal.  Exam reveals no gallop and no friction rub.   No murmur heard. Pulmonary/Chest: Effort normal and breath sounds normal. No respiratory distress. He exhibits no tenderness.  Abdominal: Soft. Normal appearance and bowel sounds are normal. There is no hepatosplenomegaly. There is no tenderness. There is no rebound, no guarding, no tenderness at McBurney's point and negative Murphy's sign. No hernia.  Musculoskeletal: Normal range of motion.  Neurological: He is alert and oriented to person, place, and time. He has normal strength. No cranial nerve deficit or sensory deficit. Coordination normal. GCS eye subscore is 4. GCS verbal subscore is 5. GCS motor subscore is 6.  Skin: Skin is warm, dry and intact. No rash noted. No cyanosis.  Psychiatric: He has a normal mood and affect. His speech is normal and behavior is normal. Thought content normal.  Nursing note and vitals reviewed.   ED Course  Procedures (including critical care time) Labs Review Labs Reviewed  BASIC METABOLIC PANEL - Abnormal; Notable for the following:    Glucose, Bld 136 (*)    BUN 22 (*)    Creatinine, Ser 1.52 (*)    GFR calc non Af Amer 42 (*)    GFR calc Af Amer 48 (*)    All other components within normal limits  CBC  URINALYSIS, ROUTINE W REFLEX MICROSCOPIC (NOT AT [facility])  I-STAT TROPOININ, ED    Imaging Review Dg Chest 2 View (if Patient Has Fever And/or Copd)  01/04/2015   CLINICAL DATA:  Shortness of breath. Weakness. Hypotension. Dementia. Hypertension. Prior CVAs.  EXAM: CHEST  2 VIEW  COMPARISON:  03/10/2013  FINDINGS: Lateral view degraded by patient arm position. Midline trachea. Normal heart size. Atherosclerosis in the transverse aorta. No pleural effusion or pneumothorax. Mild nonspecific pulmonary  interstitial thickening is felt to be similar. No lobar consolidation.  IMPRESSION: No acute cardiopulmonary disease.  Atherosclerosis.   Electronically Signed   By: Jeronimo Greaves M.D.   On: 01/04/2015 18:32     EKG Interpretation   Date/Time:  Monday January 04 2015 17:18:59 EDT Ventricular Rate:  76 PR Interval:  220 QRS Duration: 124 QT Interval:  415 QTC Calculation: 467 R Axis:   -37 Text Interpretation:  Sinus rhythm Prolonged PR interval Non-specific  intra-ventricular conduction delay No significant change since last  tracing Confirmed by Machell Wirthlin  MD, Ethelbert Thain [phone]) on 01/04/2015 5:23:47  PM      MDM   Final diagnoses:  None   generalized weakness  Patient presents to the ER for evaluation of generalized weakness. Patient was seen by primary doctor earlier today and sent to the ER for low blood pressure. Patient does have a history of orthostatic hypotension. Family reports that he  has had a slow decline over a period of months. He is now unable to ambulate more than very short distances in his apartment, needs a wheelchair to get around. This is not an acute change. They have noticed that he gets short of breath with exertion. He is asymptomatic here in the ER. Patient had borderline drop of his blood pressure without tachycardia with orthostatic measurements. He was given IV fluid bolus. The remainder of his workup has been unremarkable. He was able to ambulate in the hallway without assistance and his oxygen saturation was 97% with ambulation. He does not meet any criteria for hospitalization at this time. Arrangements have been made for the patient to have a home nursing evaluation, physical and occupational therapy.    Gilda Crease, MD 01/04/15 2139

## 2015-01-04 NOTE — ED Notes (Signed)
Pt c/o increasing SOB, weakness, and hypotension.  Denies pain.  Pt's daughter reports SOB w/ exertion.  Pt was seen by PCP this afternoon and sent to ED for evaluation.  Hx of dementia and PEs.

## 2015-01-12 ENCOUNTER — Ambulatory Visit (INDEPENDENT_AMBULATORY_CARE_PROVIDER_SITE_OTHER): Payer: Commercial Managed Care - HMO | Admitting: Neurology

## 2015-01-12 ENCOUNTER — Encounter: Payer: Self-pay | Admitting: Neurology

## 2015-01-12 VITALS — BP 108/64 | HR 62 | Resp 18 | Ht 74.0 in | Wt 244.2 lb

## 2015-01-12 DIAGNOSIS — F0280 Dementia in other diseases classified elsewhere without behavioral disturbance: Secondary | ICD-10-CM

## 2015-01-12 DIAGNOSIS — I679 Cerebrovascular disease, unspecified: Secondary | ICD-10-CM | POA: Diagnosis not present

## 2015-01-12 DIAGNOSIS — G309 Alzheimer's disease, unspecified: Secondary | ICD-10-CM

## 2015-01-12 NOTE — Patient Instructions (Signed)
Continue Namenda Continue Plavix Continue Provastatin.  Recheck fasting lipid panel Continue physical therapy.  Once you get stronger, start daily walks. Follow up in 6 months.

## 2015-01-12 NOTE — Progress Notes (Signed)
NEUROLOGY FOLLOW UP OFFICE NOTE  Cher Waterhouse [phone]  HISTORY OF PRESENT ILLNESS: Willie Mayer is a 81 year old right-handed man with history of dementia, stroke, DVT, hypertension, orthostatic hypotension and depression who follows up for Alzheimer's dementia.  Recent ED records and labs reviewed.  He is accompanied by his daughter who provides some history.   UPDATE: He takes Namenda.  Over the past several months, he has had gradual increased generalized weakness.  He is only able to ambulate short distances before needing to stop.  He has had bowel incontinence as well as bladder incontinence.  He sleeps most of the day.  Otherwise, he uses the wheelchair.  He has been experiencing dyspnea on exertion.  He does have orthostatic hypotension.  He went to the ED on 01/04/15 for low blood pressure, where he was given IV fluids. He will be starting PT soon.  He lives in independent living facility with his wife. They have 24 hour home care because of his wife's disability regarding her Parkinson's disease. He is still able to perform his activities of daily living, however he needs prompting to perform some of these tasks. He has not irritable, agitated, or combative. He does not have hallucinations or delusions. He may feel a little down but he endorses no depression.  Caregiver administers his medications.    HISTORY: Symptoms were first noted about a couple of years ago.  At first, he would often repeat questions.  He used to have a great sense of direction, however he began getting lost while driving.  His memory to detail used to be good.  He had an MRI of the brain performed around that time, which revealed a stroke, age indeterminant.  Over the next two years, memory had progressively worsened.  He had a neuropsychological evaluation performed on 02/12/13, which revealed impairment in multiple domains and was felt to be a neurodegenerative process, rather than a cerebrovascular event.   He saw a neurologist who started him on Namenda.  The next day he began having orthostatic hypotension, so some of his medications were discontinued, including the Namenda.  A month later, he had another near-syncopal event.  He is more impatient now, but otherwise he does not have any change in behavior.  He denies feeling depressed.  He does not have hallucinations or delusions.  He does not exhibit abnormal movements or parkinsonism.  Overall, he performs all ADLs, but more recently it has been a chore for family to get him to take a shower.  For the past year or so, he has had hypersomnia.  He sleeps a good 10 hours a night and feels rested in the morning.  Except he will easily feel tired and take deep sleep naps.  He had a sleep study about a year ago, which was unremarkable.  He recently moved here from [place] to live near his daughter.  He and his wife live in an independent living facility.  He takes interest in the various activities offered, such as music concerts and volleyball.  He had trouble remembering to take his medications, but now an aide comes in daily to help administer.  He no longer drives.  He no longer manages his finances alone.  He has had 3 falls since moving down here.  He did not tolerate Aricept.  It made him fatigued, gave him GI distress, and caused aggressive behavior.   PAST MEDICAL HISTORY: Past Medical History  Diagnosis Date  . Dementia   .  Hypertension   . History of CVA (cerebrovascular accident)   . History of DVT (deep vein thrombosis)   . History of pulmonary embolism   . Depression   . Glaucoma   . Urge incontinence   . BPH (benign prostatic hyperplasia)     s/p surgery that "didnt work"    MEDICATIONS: Current Outpatient Prescriptions on File Prior to Visit  Medication Sig Dispense Refill  . brimonidine-timolol (COMBIGAN) 0.2-0.5 % ophthalmic solution Place 1 drop into both eyes every 12 (twelve) hours.    . brinzolamide (AZOPT) 1 % ophthalmic  suspension Place 1 drop into the right eye 2 (two) times daily.    . citalopram (CELEXA) 20 MG tablet Take 1 tablet (20 mg total) by mouth daily. 90 tablet 1  . clopidogrel (PLAVIX) 75 MG tablet Take 1 tablet (75 mg total) by mouth daily. 90 tablet 3  . Ergocalciferol 400 UNITS TABS Take 400 Units by mouth daily. 90 tablet 3  . memantine (NAMENDA) 10 MG tablet TAKE ONE TABLET BY MOUTH TWICE DAILY 60 tablet 0  . mirabegron ER (MYRBETRIQ) 50 MG TB24 tablet Take 50 mg by mouth daily.    . Multiple Vitamin (MULTIVITAMIN WITH MINERALS) TABS tablet Take 1 tablet by mouth daily.    . pravastatin (PRAVACHOL) 20 MG tablet TAKE ONE TABLET BY MOUTH ONCE DAILY 90 tablet 0  . silodosin (RAPAFLO) 8 MG CAPS capsule Take 1 capsule (8 mg total) by mouth daily with breakfast. 90 capsule 3   No current facility-administered medications on file prior to visit.    ALLERGIES: No Known Allergies  FAMILY HISTORY: Family History  Problem Relation Age of Onset  . Alcoholism Father     died in 1s related to complciations  . Heart failure Mother   . Arrhythmia Mother     SOCIAL HISTORY: History   Social History  . Marital Status: Married    Spouse Name: N/A  . Number of Children: N/A  . Years of Education: N/A   Occupational History  . Insurance     Social History Main Topics  . Smoking status: Former Smoker -- 15.00 packs/day    Quit date: 07/17/1965  . Smokeless tobacco: Never Used  . Alcohol Use: Yes     Comment: occasional gin and tonic  . Drug Use: No  . Sexual Activity: No   Other Topics Concern  . Not on file   Social History Narrative   Recently moved from [place] where he lived with his wife who has parkinsonian dementia. They were independent but moved down here after recent fall and knowledge of dementia. Live in independent living facility and indendent in ADLs.       Currently living at [facility] living. Lives with his wife. They help with medication delivery and  activities of daily life.     REVIEW OF SYSTEMS: Constitutional: No fevers, chills, or sweats, no generalized fatigue, change in appetite Eyes: No visual changes, double vision, eye pain Ear, nose and throat: No hearing loss, ear pain, nasal congestion, sore throat Cardiovascular: No chest pain, palpitations Respiratory:  No shortness of breath at rest or with exertion, wheezes GastrointestinaI: No nausea, vomiting, diarrhea, abdominal pain, fecal incontinence Genitourinary:  No dysuria, urinary retention or frequency Musculoskeletal:  No neck pain, back pain Integumentary: No rash, pruritus, skin lesions Neurological: as above Psychiatric: No depression, insomnia, anxiety Endocrine: No palpitations, fatigue, diaphoresis, mood swings, change in appetite, change in weight, increased thirst Hematologic/Lymphatic:  No anemia, purpura,  petechiae. Allergic/Immunologic: no itchy/runny eyes, nasal congestion, recent allergic reactions, rashes  PHYSICAL EXAM: Filed Vitals:   01/12/15 1525  BP: 108/64  Pulse: 62  Resp: 18   General: No acute distress Head:  Normocephalic/atraumatic Eyes:  Fundoscopic exam unremarkable without vessel changes, exudates, hemorrhages or papilledema. Neck: supple, no paraspinal tenderness, full range of motion Heart:  Regular rate and rhythm Lungs:  Clear to auscultation bilaterally Back: No paraspinal tenderness Neurological Exam: alert and oriented to person, place, and time. Attention span and concentration intact, recent and remote memory intact, fund of knowledge intact.  Speech fluent and not dysarthric, language intact.   Montreal Cognitive Assessment  01/12/2015  Visuospatial/ Executive (0/5) 3  Naming (0/3) 2  Attention: Read list of digits (0/2) 1  Attention: Read list of letters (0/1) 1  Attention: Serial 7 subtraction starting at 100 (0/3) 3  Language: Repeat phrase (0/2) 1  Language : Fluency (0/1) 1  Abstraction (0/2) 2  Delayed Recall (0/5)  0  Orientation (0/6) 3  Total 17  Adjusted Score (based on education) 17   CN II-XII intact. Fundoscopic exam unremarkable without vessel changes, exudates, hemorrhages or papilledema.  Bulk and tone normal, muscle strength 5/5 throughout, but reduced finger-thumb tapping speed and amplitude on the right.  Sensation to pinprick and vibration reduced in left hand and leg.  Deep tendon reflexes 2+ throughout, toes downgoing.  Finger to nose intact.  Slightly wide-based gait with reduced right arm swing.  5 steps to turn.  Romberg with sway.  IMPRESSION: Alzheimer's dementia Cerebrovascular disease  PLAN: Namenda Plavix Provastatin (check fasting Lipid Panel) PT and encouraged daily walks when strong enough  30 minutes spent face to face with patient, over 50% spent discussing management and encouragement to be active and ambulatory.   Shon Millet, DO  CC: Juluis Rainier, MD

## 2015-02-01 ENCOUNTER — Other Ambulatory Visit: Payer: Self-pay | Admitting: Neurology

## 2015-02-19 ENCOUNTER — Inpatient Hospital Stay (HOSPITAL_COMMUNITY)
Admission: EM | Admit: 2015-02-19 | Discharge: 2015-02-22 | DRG: 481 | Disposition: A | Discharge status: Skilled Nursing Facility | Payer: Commercial Managed Care - HMO | Attending: Internal Medicine | Admitting: Internal Medicine

## 2015-02-19 ENCOUNTER — Encounter (HOSPITAL_COMMUNITY): Payer: Self-pay

## 2015-02-19 ENCOUNTER — Emergency Department (HOSPITAL_COMMUNITY): Payer: Commercial Managed Care - HMO

## 2015-02-19 ENCOUNTER — Inpatient Hospital Stay (HOSPITAL_COMMUNITY): Payer: Commercial Managed Care - HMO

## 2015-02-19 DIAGNOSIS — Z8249 Family history of ischemic heart disease and other diseases of the circulatory system: Secondary | ICD-10-CM | POA: Diagnosis not present

## 2015-02-19 DIAGNOSIS — F0391 Unspecified dementia with behavioral disturbance: Secondary | ICD-10-CM | POA: Diagnosis present

## 2015-02-19 DIAGNOSIS — S72001A Fracture of unspecified part of neck of right femur, initial encounter for closed fracture: Secondary | ICD-10-CM | POA: Diagnosis not present

## 2015-02-19 DIAGNOSIS — I4519 Other right bundle-branch block: Secondary | ICD-10-CM

## 2015-02-19 DIAGNOSIS — N3941 Urge incontinence: Secondary | ICD-10-CM | POA: Diagnosis present

## 2015-02-19 DIAGNOSIS — Z66 Do not resuscitate: Secondary | ICD-10-CM | POA: Diagnosis present

## 2015-02-19 DIAGNOSIS — Z79899 Other long term (current) drug therapy: Secondary | ICD-10-CM | POA: Diagnosis not present

## 2015-02-19 DIAGNOSIS — I10 Essential (primary) hypertension: Secondary | ICD-10-CM | POA: Diagnosis present

## 2015-02-19 DIAGNOSIS — M25552 Pain in left hip: Secondary | ICD-10-CM | POA: Diagnosis present

## 2015-02-19 DIAGNOSIS — S72142A Displaced intertrochanteric fracture of left femur, initial encounter for closed fracture: Secondary | ICD-10-CM

## 2015-02-19 DIAGNOSIS — Z7901 Long term (current) use of anticoagulants: Secondary | ICD-10-CM

## 2015-02-19 DIAGNOSIS — G309 Alzheimer's disease, unspecified: Secondary | ICD-10-CM

## 2015-02-19 DIAGNOSIS — F329 Major depressive disorder, single episode, unspecified: Secondary | ICD-10-CM | POA: Diagnosis present

## 2015-02-19 DIAGNOSIS — F0280 Dementia in other diseases classified elsewhere without behavioral disturbance: Secondary | ICD-10-CM | POA: Diagnosis present

## 2015-02-19 DIAGNOSIS — I4510 Unspecified right bundle-branch block: Secondary | ICD-10-CM | POA: Diagnosis present

## 2015-02-19 DIAGNOSIS — F32A Depression, unspecified: Secondary | ICD-10-CM | POA: Diagnosis present

## 2015-02-19 DIAGNOSIS — I501 Left ventricular failure: Secondary | ICD-10-CM | POA: Diagnosis present

## 2015-02-19 DIAGNOSIS — Z87891 Personal history of nicotine dependence: Secondary | ICD-10-CM | POA: Diagnosis not present

## 2015-02-19 DIAGNOSIS — Z8673 Personal history of transient ischemic attack (TIA), and cerebral infarction without residual deficits: Secondary | ICD-10-CM

## 2015-02-19 DIAGNOSIS — Z96652 Presence of left artificial knee joint: Secondary | ICD-10-CM | POA: Diagnosis present

## 2015-02-19 DIAGNOSIS — N390 Urinary tract infection, site not specified: Secondary | ICD-10-CM | POA: Diagnosis present

## 2015-02-19 DIAGNOSIS — W19XXXA Unspecified fall, initial encounter: Secondary | ICD-10-CM

## 2015-02-19 DIAGNOSIS — Z86711 Personal history of pulmonary embolism: Secondary | ICD-10-CM | POA: Diagnosis not present

## 2015-02-19 DIAGNOSIS — Z7902 Long term (current) use of antithrombotics/antiplatelets: Secondary | ICD-10-CM | POA: Diagnosis not present

## 2015-02-19 DIAGNOSIS — E785 Hyperlipidemia, unspecified: Secondary | ICD-10-CM | POA: Diagnosis present

## 2015-02-19 DIAGNOSIS — I452 Bifascicular block: Secondary | ICD-10-CM | POA: Diagnosis present

## 2015-02-19 DIAGNOSIS — W1830XA Fall on same level, unspecified, initial encounter: Secondary | ICD-10-CM | POA: Diagnosis present

## 2015-02-19 DIAGNOSIS — Z86718 Personal history of other venous thrombosis and embolism: Secondary | ICD-10-CM

## 2015-02-19 DIAGNOSIS — Z01818 Encounter for other preprocedural examination: Secondary | ICD-10-CM

## 2015-02-19 DIAGNOSIS — H409 Unspecified glaucoma: Secondary | ICD-10-CM | POA: Diagnosis present

## 2015-02-19 DIAGNOSIS — N400 Enlarged prostate without lower urinary tract symptoms: Secondary | ICD-10-CM | POA: Diagnosis present

## 2015-02-19 DIAGNOSIS — S72009A Fracture of unspecified part of neck of unspecified femur, initial encounter for closed fracture: Secondary | ICD-10-CM | POA: Diagnosis present

## 2015-02-19 DIAGNOSIS — F03918 Unspecified dementia, unspecified severity, with other behavioral disturbance: Secondary | ICD-10-CM | POA: Diagnosis present

## 2015-02-19 DIAGNOSIS — S72002A Fracture of unspecified part of neck of left femur, initial encounter for closed fracture: Secondary | ICD-10-CM | POA: Diagnosis not present

## 2015-02-19 DIAGNOSIS — N401 Enlarged prostate with lower urinary tract symptoms: Secondary | ICD-10-CM | POA: Diagnosis present

## 2015-02-19 DIAGNOSIS — Z419 Encounter for procedure for purposes other than remedying health state, unspecified: Secondary | ICD-10-CM

## 2015-02-19 DIAGNOSIS — Z9181 History of falling: Secondary | ICD-10-CM | POA: Diagnosis not present

## 2015-02-19 HISTORY — DX: Reserved for inherently not codable concepts without codable children: IMO0001

## 2015-02-19 HISTORY — DX: Cerebral infarction, unspecified: I63.9

## 2015-02-19 LAB — CBC WITH DIFFERENTIAL/PLATELET
BASOS ABS: 0 10*3/uL (ref 0.0–0.1)
BASOS PCT: 0 % (ref 0–1)
Eosinophils Absolute: 0.1 10*3/uL (ref 0.0–0.7)
Eosinophils Relative: 0 % (ref 0–5)
HCT: 39.4 % (ref 39.0–52.0)
HEMOGLOBIN: 13.3 g/dL (ref 13.0–17.0)
Lymphocytes Relative: 8 % — ABNORMAL LOW (ref 12–46)
Lymphs Abs: 1.2 10*3/uL (ref 0.7–4.0)
MCH: 30.2 pg (ref 26.0–34.0)
MCHC: 33.8 g/dL (ref 30.0–36.0)
MCV: 89.5 fL (ref 78.0–100.0)
MONO ABS: 0.6 10*3/uL (ref 0.1–1.0)
Monocytes Relative: 4 % (ref 3–12)
NEUTROS ABS: 13.4 10*3/uL — AB (ref 1.7–7.7)
NEUTROS PCT: 88 % — AB (ref 43–77)
Platelets: 159 10*3/uL (ref 150–400)
RBC: 4.4 MIL/uL (ref 4.22–5.81)
RDW: 13.1 % (ref 11.5–15.5)
WBC: 15.3 10*3/uL — ABNORMAL HIGH (ref 4.0–10.5)

## 2015-02-19 LAB — URINALYSIS, ROUTINE W REFLEX MICROSCOPIC
Bilirubin Urine: NEGATIVE
Glucose, UA: NEGATIVE mg/dL
KETONES UR: NEGATIVE mg/dL
Nitrite: NEGATIVE
PH: 5.5 (ref 5.0–8.0)
PROTEIN: NEGATIVE mg/dL
Specific Gravity, Urine: 1.024 (ref 1.005–1.030)
UROBILINOGEN UA: 1 mg/dL (ref 0.0–1.0)

## 2015-02-19 LAB — TYPE AND SCREEN
ABO/RH(D): O POS
Antibody Screen: NEGATIVE

## 2015-02-19 LAB — BASIC METABOLIC PANEL
Anion gap: 7 (ref 5–15)
BUN: 18 mg/dL (ref 6–20)
CALCIUM: 9.2 mg/dL (ref 8.9–10.3)
CHLORIDE: 103 mmol/L (ref 101–111)
CO2: 27 mmol/L (ref 22–32)
CREATININE: 1.21 mg/dL (ref 0.61–1.24)
GFR, EST NON AFRICAN AMERICAN: 55 mL/min — AB (ref >60)
Glucose, Bld: 147 mg/dL — ABNORMAL HIGH (ref 65–99)
Potassium: 4.5 mmol/L (ref 3.5–5.1)
SODIUM: 137 mmol/L (ref 135–145)

## 2015-02-19 LAB — URINE MICROSCOPIC-ADD ON

## 2015-02-19 LAB — PROTIME-INR
INR: 1.12 (ref 0.00–1.49)
Prothrombin Time: 14.6 seconds (ref 11.6–15.2)

## 2015-02-19 LAB — ABO/RH: ABO/RH(D): O POS

## 2015-02-19 MED ORDER — HYDROCODONE-ACETAMINOPHEN 5-325 MG PO TABS
1.0000 | ORAL_TABLET | Freq: Four times a day (QID) | ORAL | Status: DC | PRN
  Administered 2015-02-20: 1 via ORAL
  Filled 2015-02-19: qty 1

## 2015-02-19 MED ORDER — TAMSULOSIN HCL 0.4 MG PO CAPS
0.4000 mg | ORAL_CAPSULE | Freq: Every morning | ORAL | Status: DC
  Administered 2015-02-21 – 2015-02-22 (×2): 0.4 mg via ORAL
  Filled 2015-02-19 (×4): qty 1

## 2015-02-19 MED ORDER — MEMANTINE HCL 10 MG PO TABS
10.0000 mg | ORAL_TABLET | Freq: Two times a day (BID) | ORAL | Status: DC
  Administered 2015-02-19 – 2015-02-22 (×5): 10 mg via ORAL
  Filled 2015-02-19 (×8): qty 1

## 2015-02-19 MED ORDER — TIMOLOL MALEATE 0.5 % OP SOLN
1.0000 [drp] | Freq: Two times a day (BID) | OPHTHALMIC | Status: DC
  Administered 2015-02-19 – 2015-02-22 (×5): 1 [drp] via OPHTHALMIC
  Filled 2015-02-19: qty 5

## 2015-02-19 MED ORDER — CITALOPRAM HYDROBROMIDE 20 MG PO TABS
20.0000 mg | ORAL_TABLET | Freq: Every day | ORAL | Status: DC
  Administered 2015-02-21 – 2015-02-22 (×2): 20 mg via ORAL
  Filled 2015-02-19 (×4): qty 1

## 2015-02-19 MED ORDER — SENNOSIDES-DOCUSATE SODIUM 8.6-50 MG PO TABS: 1.0000 | ORAL_TABLET | Freq: Every evening | ORAL | Status: DC | PRN

## 2015-02-19 MED ORDER — ERGOCALCIFEROL 10 MCG (400 UNIT) PO TABS: 400.0000 [IU] | ORAL_TABLET | Freq: Every day | ORAL | Status: DC

## 2015-02-19 MED ORDER — BISACODYL 5 MG PO TBEC: 5.0000 mg | DELAYED_RELEASE_TABLET | Freq: Every day | ORAL | Status: DC | PRN

## 2015-02-19 MED ORDER — FLEET ENEMA 7-19 GM/118ML RE ENEM: 1.0000 | ENEMA | Freq: Once | RECTAL | Status: AC | PRN

## 2015-02-19 MED ORDER — SODIUM CHLORIDE 0.9 % IV SOLN
INTRAVENOUS | Status: DC
  Administered 2015-02-19 – 2015-02-20 (×3): via INTRAVENOUS
  Administered 2015-02-21: 50 mL/h via INTRAVENOUS

## 2015-02-19 MED ORDER — PRAVASTATIN SODIUM 20 MG PO TABS
20.0000 mg | ORAL_TABLET | Freq: Every day | ORAL | Status: DC
  Administered 2015-02-21 – 2015-02-22 (×2): 20 mg via ORAL
  Filled 2015-02-19 (×4): qty 1

## 2015-02-19 MED ORDER — MIRABEGRON ER 50 MG PO TB24
50.0000 mg | ORAL_TABLET | Freq: Every day | ORAL | Status: DC
  Administered 2015-02-21 – 2015-02-22 (×2): 50 mg via ORAL
  Filled 2015-02-19 (×4): qty 1

## 2015-02-19 MED ORDER — ADULT MULTIVITAMIN W/MINERALS CH
1.0000 | ORAL_TABLET | Freq: Every day | ORAL | Status: DC
  Administered 2015-02-21 – 2015-02-22 (×2): 1 via ORAL
  Filled 2015-02-19 (×4): qty 1

## 2015-02-19 MED ORDER — HEPARIN SODIUM (PORCINE) 5000 UNIT/ML IJ SOLN
5000.0000 [IU] | Freq: Three times a day (TID) | INTRAMUSCULAR | Status: DC
  Administered 2015-02-19: 5000 [IU] via SUBCUTANEOUS
  Filled 2015-02-19 (×6): qty 1

## 2015-02-19 MED ORDER — BRIMONIDINE TARTRATE 0.2 % OP SOLN
1.0000 [drp] | Freq: Two times a day (BID) | OPHTHALMIC | Status: DC
  Administered 2015-02-19 – 2015-02-22 (×5): 1 [drp] via OPHTHALMIC
  Filled 2015-02-19: qty 5

## 2015-02-19 MED ORDER — BRIMONIDINE TARTRATE-TIMOLOL 0.2-0.5 % OP SOLN: 1.0000 [drp] | Freq: Two times a day (BID) | OPHTHALMIC | Status: DC

## 2015-02-19 MED ORDER — CITALOPRAM HYDROBROMIDE 40 MG PO TABS
40.0000 mg | ORAL_TABLET | Freq: Every day | ORAL | Status: DC
  Filled 2015-02-19: qty 1

## 2015-02-19 MED ORDER — FENTANYL CITRATE (PF) 100 MCG/2ML IJ SOLN: 50.0000 ug | INTRAMUSCULAR | Status: DC | PRN

## 2015-02-19 MED ORDER — BRINZOLAMIDE 1 % OP SUSP
1.0000 [drp] | Freq: Two times a day (BID) | OPHTHALMIC | Status: DC
  Administered 2015-02-19 – 2015-02-22 (×6): 1 [drp] via OPHTHALMIC
  Filled 2015-02-19: qty 10

## 2015-02-19 MED ORDER — MORPHINE SULFATE 2 MG/ML IJ SOLN: 0.5000 mg | INTRAMUSCULAR | Status: DC | PRN

## 2015-02-19 NOTE — ED Notes (Signed)
MD at bedside. 

## 2015-02-19 NOTE — ED Notes (Signed)
Per EMS, pt from [facility].  Pt here for fall this am at approx 5:15.  Pt caregiver at 8 notified of fall.  Pt stood up to go to bathroom from his recliner.  Fell to carpet.  Pt c/o left hip pain. No LOC noted.  Pt has hx of dementia.  Vitals:  100% ra, hr 70, resp 20, bp 114/60

## 2015-02-19 NOTE — ED Provider Notes (Signed)
CSN: [id number]     Arrival date & time 02/19/15  4098 History   First MD Initiated Contact with Patient 02/19/15 [phone]     Chief Complaint  Patient presents with  . Hip Pain  . Fall     (Consider location/radiation/quality/duration/timing/severity/associated sxs/prior Treatment) Patient is a 81 y.o. male presenting with hip pain and fall. The history is provided by the patient and the EMS personnel.  Hip Pain This is a new problem. The current episode started 3 to 5 hours ago. The problem occurs constantly. The problem has not changed since onset.Pertinent negatives include no chest pain and no abdominal pain. Nothing aggravates the symptoms. Nothing relieves the symptoms. He has tried nothing for the symptoms. The treatment provided no relief.  Fall This is a new problem. The current episode started 3 to 5 hours ago. The problem occurs constantly. Pertinent negatives include no chest pain and no abdominal pain. Exacerbated by: movement. Nothing relieves the symptoms. He has tried nothing for the symptoms. The treatment provided no relief.    Past Medical History  Diagnosis Date  . Dementia   . Hypertension   . History of CVA (cerebrovascular accident)   . History of DVT (deep vein thrombosis)   . History of pulmonary embolism   . Depression   . Glaucoma   . Urge incontinence   . BPH (benign prostatic hyperplasia)     s/p surgery that "didnt work"   Past Surgical History  Procedure Laterality Date  . Replacement total knee bilateral    . Bph surgery     Family History  Problem Relation Age of Onset  . Alcoholism Father     died in 69s related to complciations  . Heart failure Mother   . Arrhythmia Mother    History  Substance Use Topics  . Smoking status: Former Smoker -- 15.00 packs/day    Quit date: 07/17/1965  . Smokeless tobacco: Never Used  . Alcohol Use: Yes     Comment: occasional gin and tonic    Review of Systems  Cardiovascular: Negative for chest pain.   Gastrointestinal: Negative for abdominal pain.  All other systems reviewed and are negative.     Allergies  Review of patient's allergies indicates no known allergies.  Home Medications   Prior to Admission medications   Medication Sig Start Date End Date Taking? Authorizing Provider  brimonidine-timolol (COMBIGAN) 0.2-0.5 % ophthalmic solution Place 1 drop into both eyes every 12 (twelve) hours.    Historical Provider, MD  brinzolamide (AZOPT) 1 % ophthalmic suspension Place 1 drop into the right eye 2 (two) times daily.    Historical Provider, MD  citalopram (CELEXA) 20 MG tablet Take 1 tablet (20 mg total) by mouth daily. 06/18/13   Myra Rude, MD  clopidogrel (PLAVIX) 75 MG tablet Take 1 tablet (75 mg total) by mouth daily. 08/14/13   Myra Rude, MD  Ergocalciferol 400 UNITS TABS Take 400 Units by mouth daily. 07/02/13   Myra Rude, MD  memantine (NAMENDA) 10 MG tablet TAKE ONE TABLET BY MOUTH TWICE DAILY 02/01/15   Drema Dallas, DO  mirabegron ER (MYRBETRIQ) 50 MG TB24 tablet Take 50 mg by mouth daily.    Historical Provider, MD  Multiple Vitamin (MULTIVITAMIN WITH MINERALS) TABS tablet Take 1 tablet by mouth daily.    Historical Provider, MD  pravastatin (PRAVACHOL) 20 MG tablet TAKE ONE TABLET BY MOUTH ONCE DAILY 06/30/14   Myra Rude, MD  silodosin (RAPAFLO)  8 MG CAPS capsule Take 1 capsule (8 mg total) by mouth daily with breakfast. 07/02/13   Myra Rude, MD   There were no vitals taken for this visit. Physical Exam  Constitutional: He is oriented to person, place, and time. He appears well-developed and well-nourished. No distress.  HENT:  Head: Normocephalic and atraumatic.  Eyes: Conjunctivae are normal.  Neck: Neck supple. No tracheal deviation present.  Cardiovascular: Normal rate and regular rhythm.   Pulmonary/Chest: Effort normal. No respiratory distress.  Abdominal: Soft. He exhibits no distension.  Musculoskeletal:       Left hip: He  exhibits tenderness and bony tenderness (at proximal femur).  Neurological: He is alert and oriented to person, place, and time.  Skin: Skin is warm and dry.  Psychiatric: He has a normal mood and affect.    ED Course  Procedures (including critical care time) Labs Review Labs Reviewed  CBC WITH DIFFERENTIAL/PLATELET - Abnormal; Notable for the following:    WBC 15.3 (*)    Neutrophils Relative % 88 (*)    Neutro Abs 13.4 (*)    Lymphocytes Relative 8 (*)    All other components within normal limits  BASIC METABOLIC PANEL - Abnormal; Notable for the following:    Glucose, Bld 147 (*)    GFR calc non Af Amer 55 (*)    All other components within normal limits  PROTIME-INR  TYPE AND SCREEN  ABO/RH    Imaging Review Dg Chest Port 1 View  02/19/2015   CLINICAL DATA:  Pain following fall. Intermittent cough and shortness of breath  EXAM: PORTABLE CHEST - 1 VIEW  COMPARISON:  January 04, 2015  FINDINGS: There is no edema or consolidation. The heart size and pulmonary vascularity are normal. No pneumothorax. No adenopathy. No bone lesions.  IMPRESSION: No edema or consolidation.   Electronically Signed   By: Bretta Bang III M.D.   On: 02/19/2015 10:02   Dg Hip Unilat With Pelvis 2-3 Views Left  02/19/2015   CLINICAL DATA:  Pain following fall earlier today  EXAM: DG HIP (WITH OR WITHOUT PELVIS) 2-3V LEFT  COMPARISON:  None.  FINDINGS: Frontal pelvis as well as frontal and lateral left hip images were obtained. On the frontal view, there is an obliquely oriented lucency in the femoral neck region on the left with apparent cortical disruption along the lateral mid femoral neck region, concerning for incomplete fracture. A fracture is not appreciable on the lateral view, however. There is no other evidence of potential fracture. No dislocation. There is slight narrowing of both hip joints. There is extensive osteoarthritic change in the visualized lumbar spine.  IMPRESSION: Concern for fracture,  obliquely oriented, in the femoral neck region, not confirmed on the lateral view. Given this appearance, correlation with CT or MR of the left hip region is advised. No other findings concerning for potential fracture. No dislocation. Mild symmetric narrowing of both hip joints. Degenerative type change in visualized lumbar spine.   Electronically Signed   By: Bretta Bang III M.D.   On: 02/19/2015 09:36   Dg Femur Min 2 Views Left  02/19/2015   CLINICAL DATA:  Fall at home today. Left hip and femur pain. Initial encounter.  EXAM: LEFT FEMUR 2 VIEWS  COMPARISON:  None.  FINDINGS: And intratrochanteric fracture is nondisplaced. A left total knee arthroplasty is noted. The knee is located. The hip is located. No additional fractures are present.  IMPRESSION: Nondisplaced intertrochanteric fracture of the left hip.  Electronically Signed   By: Marin Roberts M.D.   On: 02/19/2015 09:38   I independently viewed and interpreted the above radiology studies and agree with radiologist report.   EKG Interpretation None      MDM   Final diagnoses:  Fracture, intertrochanteric, left femur, closed, initial encounter  Fall from standing, initial encounter    81 year old male presents after a ground-level fall while standing with his walker this morning, he was lying on the floor for 3 hours before EMS was called. Family is available at bedside. High clinical concern for left hip fracture with symptoms.  Patient has left intertrochanteric femur fracture, orthopedics consulted and Hospitalist was consulted for admission and will see the patient in the emergency department.   Lyndal Pulley, MD 02/19/15 [phone]

## 2015-02-19 NOTE — Clinical Social Work Note (Signed)
Clinical Social Work Assessment  Patient Details  Name: Willie H Otter Jr. MRN: [id number] Date of Birth: 12/25/1932  Date of referral:  02/19/15               Reason for consult:  Facility Placement, Discharge Planning                Permission sought to share information with:    Permission granted to share information::     Name::        Agency::     Relationship::     Contact Information:     Housing/Transportation Living arrangements for the past 2 months:  Independent Living Facility Source of Information:  Patient, Adult Children Patient Interpreter Needed:  None Criminal Activity/Legal Involvement Pertinent to Current Situation/Hospitalization:  No - Comment as needed Significant Relationships:  Adult Children, Spouse Lives with:  Spouse Do you feel safe going back to the place where you live?  Yes Need for family participation in patient care:  Yes (Comment)  Care giving concerns:  Pt's care may not be able to be managed at home following hospital d/c.   Social Worker assessment / plan:  Pt hospitalized on 02/19/15 with a hip fx. Ortho to consult tonite and determine treatment plan. Surgery may be needed. CSW met with pt / daughter to assist with d/c planning. Daughter feels that ST Rehab may be needed prior to returning home. CSW has initiated SNF search and bed offers are pending. Pt has [employer] ( silverback ) insurance which requires prior authorization. CSW will assist with authorization process once PT recommendations are available.  Employment status:  Retired Insurance information:  Managed Medicare PT Recommendations:  Not assessed at this time Information / Referral to community resources:  Skilled Nursing Facility  Patient/Family's Response to care:  Pt has 24 / 7 assistance at home but still fell while getting up to use the bathroom during the night. Daughter feels ST Rehab may be helpful at d/c.  Patient/Family's Understanding of and Emotional Response to  Diagnosis, Current Treatment, and Prognosis:  Pt is aware he has a hip fx and is hoping he will see the Ortho MD soon to determine if surgery is needed. Daughter doesn't expect him to like the idea of going to ST rehab but states he will accept dc plan.  Emotional Assessment Appearance:  Appears stated age Attitude/Demeanor/Rapport:  Other (cooperative) Affect (typically observed):  Calm, Pleasant Orientation:  Oriented to Self, Oriented to Place, Oriented to  Time, Oriented to Situation Alcohol / Substance use:  Alcohol Use Psych involvement (Current and /or in the community):  No (Comment)  Discharge Needs  Concerns to be addressed:  Discharge Planning Concerns Readmission within the last 30 days:  No Current discharge risk:  None Barriers to Discharge:  No Barriers Identified   ,  Lee, LCSW 02/19/2015, 3:10 PM  

## 2015-02-19 NOTE — ED Provider Notes (Signed)
ECG interpretation   Date: 02/19/2015  Rate: 88  Rhythm: normal sinus rhythm  QRS Axis: normal  Intervals: normal  ST/T Wave abnormalities: normal  Conduction Disutrbances: RBBB  Narrative Interpretation:   Old EKG Reviewed: No significant changes noted     Azalia Bilis, MD 02/19/15 1701

## 2015-02-19 NOTE — Progress Notes (Signed)
*  PRELIMINARY RESULTS* Echocardiogram 2D Echocardiogram has been performed.  Willie Mayer 02/19/2015, 3:04 PM

## 2015-02-19 NOTE — Progress Notes (Signed)
Pharmacy Note:  Order received to continue citalopram at home dosage of 40 mg daily.  However, maximum recommended dosage when age > 60 is 20 mg daily due to potential for QTc prolongation and life-threatening arrhythmias.  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  Spoke with Dr. Randol Kern and received order to reduce citalopram to 20 mg daily.  Elie Goody, PharmD, BCPS Pager: [phone] 02/19/2015  2:17 PM

## 2015-02-19 NOTE — Clinical Social Work Placement (Signed)
   CLINICAL SOCIAL WORK PLACEMENT  NOTE  Date:  02/19/2015  Patient Details  Name: Willie Mayer. MRN: [id number] Date of Birth: [birth year]  Clinical Social Work is seeking post-discharge placement for this patient at the Skilled  Nursing Facility level of care (*CSW will initial, date and re-position this form in  chart as items are completed):  Yes   Patient/family provided with [facility] Clinical Social Work Department's list of facilities offering this level of care within the geographic area requested by the patient (or if unable, by the patient's family).  Yes   Patient/family informed of their freedom to choose among providers that offer the needed level of care, that participate in Medicare, Medicaid or managed care program needed by the patient, have an available bed and are willing to accept the patient.  Yes   Patient/family informed of [facility]'s ownership interest in [facility] and [facility], as well as of the fact that they are under no obligation to receive care at these facilities.  PASRR submitted to EDS on 02/19/15     PASRR number received on 02/19/15     Existing PASRR number confirmed on       FL2 transmitted to all facilities in geographic area requested by pt/family on 02/19/15     FL2 transmitted to all facilities within larger geographic area on       Patient informed that his/her managed care company has contracts with or will negotiate with certain facilities, including the following:            Patient/family informed of bed offers received.  Patient chooses bed at       Physician recommends and patient chooses bed at      Patient to be transferred to   on  .  Patient to be transferred to facility by       Patient family notified on   of transfer.  Name of family member notified:        PHYSICIAN       Additional Comment:    _______________________________________________ Royetta Asal, LCSW [phone] 02/19/2015, 3:21  PM

## 2015-02-19 NOTE — H&P (Signed)
Patient Demographics  Willie Mayer, is a 81 y.o. male  MRN: [id number]   DOB - [birth year]  Admit Date - 02/19/2015  Outpatient Primary MD for the patient is Gaye Alken, MD   With History of -  Past Medical History  Diagnosis Date  . Dementia   . Hypertension   . History of CVA (cerebrovascular accident)   . History of DVT (deep vein thrombosis)   . History of pulmonary embolism   . Depression   . Glaucoma   . Urge incontinence   . BPH (benign prostatic hyperplasia)     s/p surgery that "didnt work"      Past Surgical History  Procedure Laterality Date  . Replacement total knee bilateral    . Bph surgery      in for   Chief Complaint  Patient presents with  . Hip Pain  . Fall     HPI  Willie Mayer  is a 81 y.o. male, past medical history significant for history CVA, alzheimers dementia , history DVT/PE ,hyperlipidemia,  Lives at home with his wife with 24 hours care, ambulates with no assistance for short distance, walker for longer distance in-home, presents with fall this a.m. while going to the bathroom, and left hip pain, patient had ground-level fall while standing with his walker this morning, he was laying on the floor for 3 hours before EMS were called by home aid, hip x-ray significant for nondisplaced intertrochanteric femur fracture, patient denies any chest pain, shortness of breath, fever, chills, workup was significant for leukocytosis at 15,000, he denies any dysuria or polyuria cough or fever, will EKG showing new RBBB, and LAFB.   Review of Systems    In addition to the HPI above,  No Fever-chills, No Headache, No changes with Vision or hearing, No problems swallowing food or Liquids, No Chest pain, Cough or Shortness of Breath, No Abdominal pain, No Nausea or Vommitting, Bowel movements are regular, No Blood in stool or Urine, No dysuria, No new skin rashes or bruises, Complaints of left hip pain No new weakness, tingling,  numbness in any extremity, No recent weight gain or loss, No polyuria, polydypsia or polyphagia, No significant Mental Stressors.  A full 10 point Review of Systems was done, except as stated above, all other Review of Systems were negative.   Social History History  Substance Use Topics  . Smoking status: Former Smoker -- 15.00 packs/day    Quit date: 07/17/1965  . Smokeless tobacco: Never Used  . Alcohol Use: Yes     Comment: occasional gin and tonic     Family History Family History  Problem Relation Age of Onset  . Alcoholism Father     died in 57s related to complciations  . Heart failure Mother   . Arrhythmia Mother      Prior to Admission medications   Medication Sig Start Date End Date Taking? Authorizing Provider  brimonidine-timolol (COMBIGAN) 0.2-0.5 % ophthalmic solution Place 1 drop into both eyes every 12 (twelve) hours.   Yes Historical Provider, MD  brinzolamide (AZOPT) 1 % ophthalmic suspension Place 1 drop into the right eye 2 (two) times daily.   Yes Historical Provider, MD  citalopram (CELEXA) 40 MG tablet Take 40 mg by mouth daily.   Yes Historical Provider, MD  clopidogrel (PLAVIX) 75 MG tablet Take 1 tablet (75 mg total) by mouth daily. 08/14/13  Yes Myra Rude, MD  Ergocalciferol 400 UNITS TABS Take 400 Units by  mouth daily. 07/02/13  Yes Myra Rude, MD  memantine (NAMENDA) 10 MG tablet TAKE ONE TABLET BY MOUTH TWICE DAILY 02/01/15  Yes Drema Dallas, DO  mirabegron ER (MYRBETRIQ) 50 MG TB24 tablet Take 50 mg by mouth daily.   Yes Historical Provider, MD  Multiple Vitamin (MULTIVITAMIN WITH MINERALS) TABS tablet Take 1 tablet by mouth daily.   Yes Historical Provider, MD  pravastatin (PRAVACHOL) 20 MG tablet TAKE ONE TABLET BY MOUTH ONCE DAILY 06/30/14  Yes Myra Rude, MD  tamsulosin (FLOMAX) 0.4 MG CAPS capsule Take 0.4 mg by mouth every morning.   Yes Historical Provider, MD    No Known Allergies  Physical Exam  Vitals  Blood  pressure 135/78, pulse 83, temperature 98 F (36.7 C), temperature source Oral, resp. rate 20, SpO2 98 %.   1. General frail elderly obese male lying in bed in NAD,    2. Normal affect and insight, Not Suicidal or Homicidal, Awake Alert, confused but pleasant.  3. No F.N deficits, ALL C.Nerves Intact, Strength grossly intact, Plantars down going.  4. Ears and Eyes appear Normal, Conjunctivae clear, PERRLA. Moist Oral Mucosa.  5. Supple Neck, No JVD, No cervical lymphadenopathy appriciated, No Carotid Bruits.  6. Symmetrical Chest wall movement, Good air movement bilaterally, CTAB.  7. RRR, No Gallops, Rubs or Murmurs, No Parasternal Heave.  8. Positive Bowel Sounds, Abdomen Soft, No tenderness, No organomegaly appriciated,No rebound -guarding or rigidity.  9.  No Cyanosis, Normal Skin Turgor, No Skin Rash or Bruise.  10. Good muscle tone,  joints appear normal , no effusions, Normal ROM.  11. No Palpable Lymph Nodes in Neck or Axillae    Data Review  CBC  Recent Labs Lab 02/19/15 0946  WBC 15.3*  HGB 13.3  HCT 39.4  PLT 159  MCV 89.5  MCH 30.2  MCHC 33.8  RDW 13.1  LYMPHSABS 1.2  MONOABS 0.6  EOSABS 0.1  BASOSABS 0.0   ------------------------------------------------------------------------------------------------------------------  Chemistries   Recent Labs Lab 02/19/15 0946  NA 137  K 4.5  CL 103  CO2 27  GLUCOSE 147*  BUN 18  CREATININE 1.21  CALCIUM 9.2   ------------------------------------------------------------------------------------------------------------------ CrCl cannot be calculated (Unknown ideal weight.). ------------------------------------------------------------------------------------------------------------------ No results for input(s): TSH, T4TOTAL, T3FREE, THYROIDAB in the last 72 hours.  Invalid input(s): FREET3   Coagulation profile  Recent Labs Lab 02/19/15 0946  INR 1.12    ------------------------------------------------------------------------------------------------------------------- No results for input(s): DDIMER in the last 72 hours. -------------------------------------------------------------------------------------------------------------------  Cardiac Enzymes No results for input(s): CKMB, TROPONINI, MYOGLOBIN in the last 168 hours.  Invalid input(s): CK ------------------------------------------------------------------------------------------------------------------ Invalid input(s): POCBNP   ---------------------------------------------------------------------------------------------------------------  Urinalysis    Component Value Date/Time   COLORURINE YELLOW 02/19/2015 1111   APPEARANCEUR CLEAR 02/19/2015 1111   LABSPEC 1.024 02/19/2015 1111   PHURINE 5.5 02/19/2015 1111   GLUCOSEU NEGATIVE 02/19/2015 1111   HGBUR TRACE* 02/19/2015 1111   BILIRUBINUR NEGATIVE 02/19/2015 1111   KETONESUR NEGATIVE 02/19/2015 1111   PROTEINUR NEGATIVE 02/19/2015 1111   UROBILINOGEN 1.0 02/19/2015 1111   NITRITE NEGATIVE 02/19/2015 1111   LEUKOCYTESUR TRACE* 02/19/2015 1111    ----------------------------------------------------------------------------------------------------------------  Imaging results:   Dg Chest Port 1 View  02/19/2015   CLINICAL DATA:  Pain following fall. Intermittent cough and shortness of breath  EXAM: PORTABLE CHEST - 1 VIEW  COMPARISON:  January 04, 2015  FINDINGS: There is no edema or consolidation. The heart size and pulmonary vascularity are normal. No pneumothorax. No adenopathy. No bone lesions.  IMPRESSION: No edema or consolidation.   Electronically Signed   By: Bretta Bang III M.D.   On: 02/19/2015 10:02   Dg Hip Unilat With Pelvis 2-3 Views Left  02/19/2015   CLINICAL DATA:  Pain following fall earlier today  EXAM: DG HIP (WITH OR WITHOUT PELVIS) 2-3V LEFT  COMPARISON:  None.  FINDINGS: Frontal pelvis as well  as frontal and lateral left hip images were obtained. On the frontal view, there is an obliquely oriented lucency in the femoral neck region on the left with apparent cortical disruption along the lateral mid femoral neck region, concerning for incomplete fracture. A fracture is not appreciable on the lateral view, however. There is no other evidence of potential fracture. No dislocation. There is slight narrowing of both hip joints. There is extensive osteoarthritic change in the visualized lumbar spine.  IMPRESSION: Concern for fracture, obliquely oriented, in the femoral neck region, not confirmed on the lateral view. Given this appearance, correlation with CT or MR of the left hip region is advised. No other findings concerning for potential fracture. No dislocation. Mild symmetric narrowing of both hip joints. Degenerative type change in visualized lumbar spine.   Electronically Signed   By: Bretta Bang III M.D.   On: 02/19/2015 09:36   Dg Femur Min 2 Views Left  02/19/2015   CLINICAL DATA:  Fall at home today. Left hip and femur pain. Initial encounter.  EXAM: LEFT FEMUR 2 VIEWS  COMPARISON:  None.  FINDINGS: And intratrochanteric fracture is nondisplaced. A left total knee arthroplasty is noted. The knee is located. The hip is located. No additional fractures are present.  IMPRESSION: Nondisplaced intertrochanteric fracture of the left hip.   Electronically Signed   By: Marin Roberts M.D.   On: 02/19/2015 09:38    My personal review of EKG: Rhythm NSR, Rate  88 /min, QTc 528 , with new RBBB, and LAFB    Assessment & Plan  Active Problems:   Dementia with behavioral disturbance   Hypertension   History of CVA (cerebrovascular accident)   Depression   Glaucoma   Alzheimer's disease   Hip fracture    Left hip fracture - Patient with fall, not associated with syncope or lightheadedness, with nondisplaced intertrochanteric hip fracture, will need surgical repair, discussed with  Dr. Ophelia Charter who will evaluate this evening. - Giving a new finding of RBBB and LAFB on EKG, will obtain 2-D echo, cardiology consulted for preop cardiac clearance. - We'll hold Plavix. Surgery - Patient denies any chest pain, shortness of breath or palpitation - On when necessary pain medication, and heparin for DVT prophylaxis  History of CVA - Resume Plavix after surgery.  Glaucoma - Continue with home medication  Alzheimer's disease - Continue with Namenda  Depression - Continue with Celexa  Hyperlipidemia - Continue with statin   DVT Prophylaxis Heparin -    AM Labs Ordered, also please review Full Orders  Family Communication: Admission, patients condition and plan of care including tests being ordered have been discussed with the patient and daughter who indicate understanding and agree with the plan and Code Status.  Code Status DO NOT RESUSCITATE  Likely DC to  : Pending hospital course  Condition GUARDED    Time spent in minutes : 55 minutes    Alila Sotero M.D on 02/19/2015 at 11:45 AM  Between 7am to 7pm - Pager - [phone]  After 7pm go to [email or web address] - password [email or web address]  And look for the night coverage person  covering me after hours  [facility] Office  [phone]

## 2015-02-19 NOTE — Consult Note (Addendum)
Reason for Consult:left nondisplaced IT hip Fx after fall Referring Physician: Elgergawy MD  Willie Mayer. is an 81 y.o. male.  HPI: pt with dementia round the clock care giver  House ambulator only with Hx of falls. On plavix . Hx of CVA.  [address] in living room. Lives with wife who has parkinsons disease.   Past Medical History  Diagnosis Date  . Dementia   . Hypertension   . History of CVA (cerebrovascular accident)   . History of DVT (deep vein thrombosis)   . History of pulmonary embolism   . Depression   . Glaucoma   . Urge incontinence   . BPH (benign prostatic hyperplasia)     s/p surgery that "didnt work"  . Stroke   . Shortness of breath dyspnea     Past Surgical History  Procedure Laterality Date  . Replacement total knee bilateral    . Bph surgery    . Joint replacement      Family History  Problem Relation Age of Onset  . Alcoholism Father     died in 15s related to complciations  . Heart failure Mother   . Arrhythmia Mother     Social History:  reports that he quit smoking about 49 years ago. He has never used smokeless tobacco. He reports that he drinks alcohol. He reports that he does not use illicit drugs.  Allergies: No Known Allergies  Medications: I have reviewed the patient's current medications.  Results for orders placed or performed during the hospital encounter of 02/19/15 (from the past 48 hour(s))  ABO/Rh     Status: None   Collection Time: 02/19/15  9:34 AM  Result Value Ref Range   ABO/RH(D) O POS   CBC with Differential/Platelet     Status: Abnormal   Collection Time: 02/19/15  9:46 AM  Result Value Ref Range   WBC 15.3 (H) 4.0 - 10.5 K/uL   RBC 4.40 4.22 - 5.81 MIL/uL   Hemoglobin 13.3 13.0 - 17.0 g/dL   HCT 39.4 39.0 - 52.0 %   MCV 89.5 78.0 - 100.0 fL   MCH 30.2 26.0 - 34.0 pg   MCHC 33.8 30.0 - 36.0 g/dL   RDW 13.1 11.5 - 15.5 %   Platelets 159 150 - 400 K/uL   Neutrophils Relative % 88 (H) 43 - 77 %   Neutro Abs 13.4 (H)  1.7 - 7.7 K/uL   Lymphocytes Relative 8 (L) 12 - 46 %   Lymphs Abs 1.2 0.7 - 4.0 K/uL   Monocytes Relative 4 3 - 12 %   Monocytes Absolute 0.6 0.1 - 1.0 K/uL   Eosinophils Relative 0 0 - 5 %   Eosinophils Absolute 0.1 0.0 - 0.7 K/uL   Basophils Relative 0 0 - 1 %   Basophils Absolute 0.0 0.0 - 0.1 K/uL  Basic metabolic panel     Status: Abnormal   Collection Time: 02/19/15  9:46 AM  Result Value Ref Range   Sodium 137 135 - 145 mmol/L   Potassium 4.5 3.5 - 5.1 mmol/L   Chloride 103 101 - 111 mmol/L   CO2 27 22 - 32 mmol/L   Glucose, Bld 147 (H) 65 - 99 mg/dL   BUN 18 6 - 20 mg/dL   Creatinine, Ser 1.21 0.61 - 1.24 mg/dL   Calcium 9.2 8.9 - 10.3 mg/dL   GFR calc non Af Amer 55 (L) >60 mL/min   GFR calc Af Amer >60 >60 mL/min  Comment: (NOTE) The eGFR has been calculated using the CKD EPI equation. This calculation has not been validated in all clinical situations. eGFR's persistently <60 mL/min signify possible Chronic Kidney Disease.    Anion gap 7 5 - 15  Protime-INR     Status: None   Collection Time: 02/19/15  9:46 AM  Result Value Ref Range   Prothrombin Time 14.6 11.6 - 15.2 seconds   INR 1.12 0.00 - 1.49  Type and screen     Status: None   Collection Time: 02/19/15 10:27 AM  Result Value Ref Range   ABO/RH(D) O POS    Antibody Screen NEG    Sample Expiration 02/22/2015   Urinalysis, Routine w reflex microscopic (not at [facility])     Status: Abnormal   Collection Time: 02/19/15 11:11 AM  Result Value Ref Range   Color, Urine YELLOW YELLOW   APPearance CLEAR CLEAR   Specific Gravity, Urine 1.024 1.005 - 1.030   pH 5.5 5.0 - 8.0   Glucose, UA NEGATIVE NEGATIVE mg/dL   Hgb urine dipstick TRACE (A) NEGATIVE   Bilirubin Urine NEGATIVE NEGATIVE   Ketones, ur NEGATIVE NEGATIVE mg/dL   Protein, ur NEGATIVE NEGATIVE mg/dL   Urobilinogen, UA 1.0 0.0 - 1.0 mg/dL   Nitrite NEGATIVE NEGATIVE   Leukocytes, UA TRACE (A) NEGATIVE  Urine microscopic-add on     Status: None    Collection Time: 02/19/15 11:11 AM  Result Value Ref Range   WBC, UA 3-6 <3 WBC/hpf   Urine-Other MUCOUS PRESENT     Dg Chest Port 1 View  02/19/2015   CLINICAL DATA:  Pain following fall. Intermittent cough and shortness of breath  EXAM: PORTABLE CHEST - 1 VIEW  COMPARISON:  January 04, 2015  FINDINGS: There is no edema or consolidation. The heart size and pulmonary vascularity are normal. No pneumothorax. No adenopathy. No bone lesions.  IMPRESSION: No edema or consolidation.   Electronically Signed   By: Lowella Grip III M.D.   On: 02/19/2015 10:02   Dg Hip Unilat With Pelvis 2-3 Views Left  02/19/2015   CLINICAL DATA:  Pain following fall earlier today  EXAM: DG HIP (WITH OR WITHOUT PELVIS) 2-3V LEFT  COMPARISON:  None.  FINDINGS: Frontal pelvis as well as frontal and lateral left hip images were obtained. On the frontal view, there is an obliquely oriented lucency in the femoral neck region on the left with apparent cortical disruption along the lateral mid femoral neck region, concerning for incomplete fracture. A fracture is not appreciable on the lateral view, however. There is no other evidence of potential fracture. No dislocation. There is slight narrowing of both hip joints. There is extensive osteoarthritic change in the visualized lumbar spine.  IMPRESSION: Concern for fracture, obliquely oriented, in the femoral neck region, not confirmed on the lateral view. Given this appearance, correlation with CT or MR of the left hip region is advised. No other findings concerning for potential fracture. No dislocation. Mild symmetric narrowing of both hip joints. Degenerative type change in visualized lumbar spine.   Electronically Signed   By: Lowella Grip III M.D.   On: 02/19/2015 09:36   Dg Femur Min 2 Views Left  02/19/2015   CLINICAL DATA:  Fall at home today. Left hip and femur pain. Initial encounter.  EXAM: LEFT FEMUR 2 VIEWS  COMPARISON:  None.  FINDINGS: And intratrochanteric fracture  is nondisplaced. A left total knee arthroplasty is noted. The knee is located. The hip is located.  No additional fractures are present.  IMPRESSION: Nondisplaced intertrochanteric fracture of the left hip.   Electronically Signed   By: San Morelle M.D.   On: 02/19/2015 09:38    Review of Systems  Constitutional: Negative for fever and chills.  HENT: Negative for hearing loss.   Respiratory: Positive for cough.        Smoked many years ago , quit 79 yrs ago  Cardiovascular: Negative for chest pain and orthopnea.  Gastrointestinal: Negative for heartburn.  Musculoskeletal: Positive for falls. Negative for myalgias.  Neurological: Negative for headaches.  Psychiatric/Behavioral: Positive for memory loss.   Blood pressure 149/81, pulse 83, temperature 98.7 F (37.1 C), temperature source Oral, resp. rate 18, SpO2 98 %. Physical Exam  Constitutional: He appears well-developed and well-nourished.  HENT:  Head: Normocephalic and atraumatic.  Eyes: Pupils are equal, round, and reactive to light.  Neck: Normal range of motion.  Cardiovascular: Normal rate and regular rhythm.   Respiratory: Effort normal. He has no rales. He exhibits no tenderness.  GI: Soft. He exhibits no distension. There is no tenderness.  Musculoskeletal:  Pain with hip ROM left . Pulses intact  Neurological: He is alert. He exhibits normal muscle tone.  Psychiatric:  Dementia. Pleasant , answers questions with some errors.  ( I live with my Mother.......)     Assessment/Plan: Left Nondisplaced IT hip Fx.  RBBB on EKG. Has had echo. Cardiology clearance pending . Will post for surgery late morning tomorrow and cancel if cardiology feels he should not proceed. Risks of surgery discussed with pt , daughter (POA)  And Son -In -Law. They request we proceed if acceptable with cardiology after evaluation.                   [place] PHONE IS [phone]  Carmencita Cusic C 02/19/2015, 7:33 PM   Patient has Hx of PE. Would  likely use Lovenox while in hospital but after discharge risks of falling with head injury vs risks of DVT and then possible PE will have to be weighed. Both options include risks due to his Hx of dementia and falls. If chemical prophylaxis chosen would go with Warfarin since it can be reversed. Likely will need SNF for 20 days then back home with sitter.

## 2015-02-20 ENCOUNTER — Inpatient Hospital Stay (HOSPITAL_COMMUNITY): Payer: Commercial Managed Care - HMO | Admitting: Anesthesiology

## 2015-02-20 ENCOUNTER — Inpatient Hospital Stay (HOSPITAL_COMMUNITY): Payer: Commercial Managed Care - HMO

## 2015-02-20 ENCOUNTER — Encounter (HOSPITAL_COMMUNITY)
Admission: EM | Disposition: A | Discharge status: Skilled Nursing Facility | Payer: Self-pay | Source: Home / Self Care | Attending: Internal Medicine

## 2015-02-20 DIAGNOSIS — I10 Essential (primary) hypertension: Secondary | ICD-10-CM

## 2015-02-20 DIAGNOSIS — Z01818 Encounter for other preprocedural examination: Secondary | ICD-10-CM

## 2015-02-20 DIAGNOSIS — I452 Bifascicular block: Secondary | ICD-10-CM | POA: Diagnosis present

## 2015-02-20 DIAGNOSIS — I4519 Other right bundle-branch block: Secondary | ICD-10-CM

## 2015-02-20 HISTORY — PX: INTRAMEDULLARY (IM) NAIL INTERTROCHANTERIC: SHX5875

## 2015-02-20 LAB — GLUCOSE, CAPILLARY: Glucose-Capillary: 161 mg/dL — ABNORMAL HIGH (ref 65–99)

## 2015-02-20 LAB — CBC
HCT: 40.1 % (ref 39.0–52.0)
HEMOGLOBIN: 13.4 g/dL (ref 13.0–17.0)
MCH: 30.3 pg (ref 26.0–34.0)
MCHC: 33.4 g/dL (ref 30.0–36.0)
MCV: 90.7 fL (ref 78.0–100.0)
PLATELETS: 146 10*3/uL — AB (ref 150–400)
RBC: 4.42 MIL/uL (ref 4.22–5.81)
RDW: 13.2 % (ref 11.5–15.5)
WBC: 14.9 10*3/uL — ABNORMAL HIGH (ref 4.0–10.5)

## 2015-02-20 LAB — CREATININE, SERUM
Creatinine, Ser: 1.03 mg/dL (ref 0.61–1.24)
GFR calc Af Amer: >60 mL/min (ref >60)
GFR calc non Af Amer: >60 mL/min (ref >60)

## 2015-02-20 SURGERY — FIXATION, FRACTURE, INTERTROCHANTERIC, WITH INTRAMEDULLARY ROD
Anesthesia: General | Site: Hip | Laterality: Left

## 2015-02-20 MED ORDER — MORPHINE SULFATE 2 MG/ML IJ SOLN: 1.0000 mg | INTRAMUSCULAR | Status: DC | PRN

## 2015-02-20 MED ORDER — PHENOL 1.4 % MT LIQD
1.0000 | OROMUCOSAL | Status: DC | PRN
  Filled 2015-02-20: qty 177

## 2015-02-20 MED ORDER — WARFARIN - PHARMACIST DOSING INPATIENT: Freq: Every day | Status: DC

## 2015-02-20 MED ORDER — ROCURONIUM BROMIDE 100 MG/10ML IV SOLN
INTRAVENOUS | Status: AC
  Filled 2015-02-20: qty 1

## 2015-02-20 MED ORDER — CEFAZOLIN SODIUM-DEXTROSE 2-3 GM-% IV SOLR
2.0000 g | INTRAVENOUS | Status: AC
  Administered 2015-02-20: 2 g via INTRAVENOUS

## 2015-02-20 MED ORDER — METOCLOPRAMIDE HCL 10 MG PO TABS
5.0000 mg | ORAL_TABLET | Freq: Three times a day (TID) | ORAL | Status: DC | PRN
Start: 2015-02-20 — End: 2015-02-22

## 2015-02-20 MED ORDER — HYDROCODONE-ACETAMINOPHEN 5-325 MG PO TABS: 1.0000 | ORAL_TABLET | Freq: Four times a day (QID) | ORAL | Status: DC | PRN

## 2015-02-20 MED ORDER — DEXAMETHASONE SODIUM PHOSPHATE 10 MG/ML IJ SOLN
INTRAMUSCULAR | Status: DC | PRN
  Administered 2015-02-20: 10 mg via INTRAVENOUS

## 2015-02-20 MED ORDER — LACTATED RINGERS IV SOLN
INTRAVENOUS | Status: DC | PRN
  Administered 2015-02-20: 12:00:00 via INTRAVENOUS

## 2015-02-20 MED ORDER — BUPIVACAINE HCL (PF) 0.5 % IJ SOLN
INTRAMUSCULAR | Status: AC
  Filled 2015-02-20: qty 30

## 2015-02-20 MED ORDER — ONDANSETRON HCL 4 MG PO TABS: 4.0000 mg | ORAL_TABLET | Freq: Four times a day (QID) | ORAL | Status: DC | PRN

## 2015-02-20 MED ORDER — FENTANYL CITRATE (PF) 100 MCG/2ML IJ SOLN
INTRAMUSCULAR | Status: AC
  Filled 2015-02-20: qty 2

## 2015-02-20 MED ORDER — DEXAMETHASONE SODIUM PHOSPHATE 10 MG/ML IJ SOLN
INTRAMUSCULAR | Status: AC
  Filled 2015-02-20: qty 1

## 2015-02-20 MED ORDER — 0.9 % SODIUM CHLORIDE (POUR BTL) OPTIME
TOPICAL | Status: DC | PRN
  Administered 2015-02-20: 1000 mL

## 2015-02-20 MED ORDER — ACETAMINOPHEN 650 MG RE SUPP: 650.0000 mg | Freq: Four times a day (QID) | RECTAL | Status: DC | PRN

## 2015-02-20 MED ORDER — WARFARIN SODIUM 7.5 MG PO TABS
7.5000 mg | ORAL_TABLET | Freq: Once | ORAL | Status: AC
  Administered 2015-02-20: 7.5 mg via ORAL
  Filled 2015-02-20: qty 1

## 2015-02-20 MED ORDER — ENOXAPARIN SODIUM 30 MG/0.3ML ~~LOC~~ SOLN
30.0000 mg | SUBCUTANEOUS | Status: DC
  Administered 2015-02-21 – 2015-02-22 (×2): 30 mg via SUBCUTANEOUS
  Filled 2015-02-20 (×3): qty 0.3

## 2015-02-20 MED ORDER — ACETAMINOPHEN 325 MG PO TABS
650.0000 mg | ORAL_TABLET | Freq: Four times a day (QID) | ORAL | Status: DC | PRN
  Administered 2015-02-20 – 2015-02-22 (×5): 650 mg via ORAL
  Filled 2015-02-20 (×5): qty 2

## 2015-02-20 MED ORDER — ONDANSETRON HCL 4 MG/2ML IJ SOLN
INTRAMUSCULAR | Status: AC
  Filled 2015-02-20: qty 2

## 2015-02-20 MED ORDER — FENTANYL CITRATE (PF) 100 MCG/2ML IJ SOLN
INTRAMUSCULAR | Status: AC
  Filled 2015-02-20: qty 4

## 2015-02-20 MED ORDER — FLEET ENEMA 7-19 GM/118ML RE ENEM: 1.0000 | ENEMA | Freq: Once | RECTAL | Status: AC | PRN

## 2015-02-20 MED ORDER — HYDROCODONE-ACETAMINOPHEN 5-325 MG PO TABS
1.0000 | ORAL_TABLET | Freq: Four times a day (QID) | ORAL | Status: DC | PRN
  Administered 2015-02-22: 1 via ORAL
  Filled 2015-02-20: qty 1

## 2015-02-20 MED ORDER — PROPOFOL 10 MG/ML IV BOLUS
INTRAVENOUS | Status: AC
  Filled 2015-02-20: qty 20

## 2015-02-20 MED ORDER — BISACODYL 5 MG PO TBEC: 5.0000 mg | DELAYED_RELEASE_TABLET | Freq: Every day | ORAL | Status: DC | PRN

## 2015-02-20 MED ORDER — ONDANSETRON HCL 4 MG/2ML IJ SOLN: 4.0000 mg | Freq: Four times a day (QID) | INTRAMUSCULAR | Status: DC | PRN

## 2015-02-20 MED ORDER — DOCUSATE SODIUM 100 MG PO CAPS
100.0000 mg | ORAL_CAPSULE | Freq: Two times a day (BID) | ORAL | Status: DC
  Administered 2015-02-20 – 2015-02-22 (×4): 100 mg via ORAL

## 2015-02-20 MED ORDER — ONDANSETRON HCL 4 MG/2ML IJ SOLN
INTRAMUSCULAR | Status: DC | PRN
  Administered 2015-02-20: 4 mg via INTRAVENOUS

## 2015-02-20 MED ORDER — SUCCINYLCHOLINE CHLORIDE 20 MG/ML IJ SOLN
INTRAMUSCULAR | Status: DC | PRN
  Administered 2015-02-20: 100 mg via INTRAVENOUS

## 2015-02-20 MED ORDER — FENTANYL CITRATE (PF) 100 MCG/2ML IJ SOLN
25.0000 ug | INTRAMUSCULAR | Status: DC | PRN
  Administered 2015-02-20: 50 ug via INTRAVENOUS

## 2015-02-20 MED ORDER — CEFAZOLIN SODIUM-DEXTROSE 2-3 GM-% IV SOLR
INTRAVENOUS | Status: AC
  Filled 2015-02-20: qty 50

## 2015-02-20 MED ORDER — EPHEDRINE SULFATE 50 MG/ML IJ SOLN
INTRAMUSCULAR | Status: DC | PRN
  Administered 2015-02-20: 10 mg via INTRAVENOUS

## 2015-02-20 MED ORDER — MENTHOL 3 MG MT LOZG
1.0000 | LOZENGE | OROMUCOSAL | Status: DC | PRN
Start: 2015-02-20 — End: 2015-02-22

## 2015-02-20 MED ORDER — EPHEDRINE SULFATE 50 MG/ML IJ SOLN
INTRAMUSCULAR | Status: AC
  Filled 2015-02-20: qty 1

## 2015-02-20 MED ORDER — CEFTRIAXONE SODIUM 1 G IJ SOLR
1.0000 g | INTRAMUSCULAR | Status: DC
Start: 2015-02-20 — End: 2015-02-22
  Administered 2015-02-21: 1 g via INTRAVENOUS
  Filled 2015-02-20 (×3): qty 10

## 2015-02-20 MED ORDER — PROPOFOL 10 MG/ML IV BOLUS
INTRAVENOUS | Status: DC | PRN
  Administered 2015-02-20: 100 mg via INTRAVENOUS
  Administered 2015-02-20: 40 mg via INTRAVENOUS

## 2015-02-20 MED ORDER — FENTANYL CITRATE (PF) 250 MCG/5ML IJ SOLN
INTRAMUSCULAR | Status: DC | PRN
  Administered 2015-02-20 (×4): 25 ug via INTRAVENOUS

## 2015-02-20 MED ORDER — METOCLOPRAMIDE HCL 5 MG/ML IJ SOLN: 5.0000 mg | Freq: Three times a day (TID) | INTRAMUSCULAR | Status: DC | PRN

## 2015-02-20 SURGICAL SUPPLY — 40 items
BAG ZIPLOCK 12X15 (MISCELLANEOUS) ×3 IMPLANT
BIT DRILL CANN LG 4.3MM (BIT) ×1 IMPLANT
BNDG COHESIVE 6X5 TAN STRL LF (GAUZE/BANDAGES/DRESSINGS) ×3 IMPLANT
DRAPE C-ARM 42X120 X-RAY (DRAPES) ×3 IMPLANT
DRAPE INCISE IOBAN 66X45 STRL (DRAPES) ×3 IMPLANT
DRAPE ORTHO SPLIT 77X108 STRL (DRAPES) ×2
DRAPE SHEET LG 3/4 BI-LAMINATE (DRAPES) ×3 IMPLANT
DRAPE STERI IOBAN 125X83 (DRAPES) ×3 IMPLANT
DRAPE SURG ORHT 6 SPLT 77X108 (DRAPES) ×1 IMPLANT
DRAPE U-SHAPE 47X51 STRL (DRAPES) ×3 IMPLANT
DRILL BIT CANN LG 4.3MM (BIT) ×3
DRSG PAD ABDOMINAL 8X10 ST (GAUZE/BANDAGES/DRESSINGS) ×3 IMPLANT
DURAPREP 26ML APPLICATOR (WOUND CARE) ×3 IMPLANT
ELECT REM PT RETURN 9FT ADLT (ELECTROSURGICAL) ×3
ELECTRODE REM PT RTRN 9FT ADLT (ELECTROSURGICAL) ×1 IMPLANT
FACESHIELD WRAPAROUND (MASK) ×6 IMPLANT
GAUZE SPONGE 4X4 12PLY STRL (GAUZE/BANDAGES/DRESSINGS) ×3 IMPLANT
GAUZE XEROFORM 1X8 LF (GAUZE/BANDAGES/DRESSINGS) ×3 IMPLANT
GLOVE ORTHO TXT STRL SZ7.5 (GLOVE) ×3 IMPLANT
GLOVE SURG ORTHO 8.5 STRL (GLOVE) ×3 IMPLANT
GOWN STRL REUS W/TWL LRG LVL3 (GOWN DISPOSABLE) ×6 IMPLANT
GUIDEPIN 3.2X17.5 THRD DISP (PIN) ×3 IMPLANT
KIT BASIN OR (CUSTOM PROCEDURE TRAY) ×3 IMPLANT
MANIFOLD NEPTUNE II (INSTRUMENTS) ×3 IMPLANT
NAIL HIP FRACT 130D 11X180 (Screw) ×3 IMPLANT
PACK GENERAL/GYN (CUSTOM PROCEDURE TRAY) ×3 IMPLANT
PACK ORTHO EXTREMITY (CUSTOM PROCEDURE TRAY) ×3 IMPLANT
PAD ABD 8X10 STRL (GAUZE/BANDAGES/DRESSINGS) ×3 IMPLANT
PAD CAST 4YDX4 CTTN HI CHSV (CAST SUPPLIES) ×1 IMPLANT
PADDING CAST COTTON 4X4 STRL (CAST SUPPLIES) ×2
POSITIONER SURGICAL ARM (MISCELLANEOUS) ×3 IMPLANT
SCREW BONE CORTICAL 5.0X40 (Screw) ×3 IMPLANT
SCREW LAG 10.5MMX105MM HFN (Screw) ×3 IMPLANT
STAPLER VISISTAT (STAPLE) ×3 IMPLANT
STOCKINETTE 8 INCH (MISCELLANEOUS) ×3 IMPLANT
SUT VIC AB 0 CT1 36 (SUTURE) ×6 IMPLANT
SUT VIC AB 2-0 CT1 27 (SUTURE) ×2
SUT VIC AB 2-0 CT1 TAPERPNT 27 (SUTURE) ×1 IMPLANT
TAPE CLOTH SURG 6X10 WHT LF (GAUZE/BANDAGES/DRESSINGS) ×3 IMPLANT
TOWEL OR 17X26 10 PK STRL BLUE (TOWEL DISPOSABLE) ×6 IMPLANT

## 2015-02-20 NOTE — H&P (View-Only) (Signed)
Reason for Consult:left nondisplaced IT hip Fx after fall Referring Physician: Elgergawy MD  Willie H Koral Jr. is an 81 y.o. male.  HPI: pt with dementia round the clock care giver  House ambulator only with Hx of falls. On plavix . Hx of CVA.  Fell in living room. Lives with wife who has parkinsons disease.   Past Medical History  Diagnosis Date  . Dementia   . Hypertension   . History of CVA (cerebrovascular accident)   . History of DVT (deep vein thrombosis)   . History of pulmonary embolism   . Depression   . Glaucoma   . Urge incontinence   . BPH (benign prostatic hyperplasia)     s/p surgery that "didnt work"  . Stroke   . Shortness of breath dyspnea     Past Surgical History  Procedure Laterality Date  . Replacement total knee bilateral    . Bph surgery    . Joint replacement      Family History  Problem Relation Age of Onset  . Alcoholism Father     died in 70s related to complciations  . Heart failure Mother   . Arrhythmia Mother     Social History:  reports that he quit smoking about 49 years ago. He has never used smokeless tobacco. He reports that he drinks alcohol. He reports that he does not use illicit drugs.  Allergies: No Known Allergies  Medications: I have reviewed the patient's current medications.  Results for orders placed or performed during the hospital encounter of 02/19/15 (from the past 48 hour(s))  ABO/Rh     Status: None   Collection Time: 02/19/15  9:34 AM  Result Value Ref Range   ABO/RH(D) O POS   CBC with Differential/Platelet     Status: Abnormal   Collection Time: 02/19/15  9:46 AM  Result Value Ref Range   WBC 15.3 (H) 4.0 - 10.5 K/uL   RBC 4.40 4.22 - 5.81 MIL/uL   Hemoglobin 13.3 13.0 - 17.0 g/dL   HCT 39.4 39.0 - 52.0 %   MCV 89.5 78.0 - 100.0 fL   MCH 30.2 26.0 - 34.0 pg   MCHC 33.8 30.0 - 36.0 g/dL   RDW 13.1 11.5 - 15.5 %   Platelets 159 150 - 400 K/uL   Neutrophils Relative % 88 (H) 43 - 77 %   Neutro Abs 13.4 (H)  1.7 - 7.7 K/uL   Lymphocytes Relative 8 (L) 12 - 46 %   Lymphs Abs 1.2 0.7 - 4.0 K/uL   Monocytes Relative 4 3 - 12 %   Monocytes Absolute 0.6 0.1 - 1.0 K/uL   Eosinophils Relative 0 0 - 5 %   Eosinophils Absolute 0.1 0.0 - 0.7 K/uL   Basophils Relative 0 0 - 1 %   Basophils Absolute 0.0 0.0 - 0.1 K/uL  Basic metabolic panel     Status: Abnormal   Collection Time: 02/19/15  9:46 AM  Result Value Ref Range   Sodium 137 135 - 145 mmol/L   Potassium 4.5 3.5 - 5.1 mmol/L   Chloride 103 101 - 111 mmol/L   CO2 27 22 - 32 mmol/L   Glucose, Bld 147 (H) 65 - 99 mg/dL   BUN 18 6 - 20 mg/dL   Creatinine, Ser 1.21 0.61 - 1.24 mg/dL   Calcium 9.2 8.9 - 10.3 mg/dL   GFR calc non Af Amer 55 (L) >60 mL/min   GFR calc Af Amer >60 >60 mL/min      Comment: (NOTE) The eGFR has been calculated using the CKD EPI equation. This calculation has not been validated in all clinical situations. eGFR's persistently <60 mL/min signify possible Chronic Kidney Disease.    Anion gap 7 5 - 15  Protime-INR     Status: None   Collection Time: 02/19/15  9:46 AM  Result Value Ref Range   Prothrombin Time 14.6 11.6 - 15.2 seconds   INR 1.12 0.00 - 1.49  Type and screen     Status: None   Collection Time: 02/19/15 10:27 AM  Result Value Ref Range   ABO/RH(D) O POS    Antibody Screen NEG    Sample Expiration 02/22/2015   Urinalysis, Routine w reflex microscopic (not at [facility])     Status: Abnormal   Collection Time: 02/19/15 11:11 AM  Result Value Ref Range   Color, Urine YELLOW YELLOW   APPearance CLEAR CLEAR   Specific Gravity, Urine 1.024 1.005 - 1.030   pH 5.5 5.0 - 8.0   Glucose, UA NEGATIVE NEGATIVE mg/dL   Hgb urine dipstick TRACE (A) NEGATIVE   Bilirubin Urine NEGATIVE NEGATIVE   Ketones, ur NEGATIVE NEGATIVE mg/dL   Protein, ur NEGATIVE NEGATIVE mg/dL   Urobilinogen, UA 1.0 0.0 - 1.0 mg/dL   Nitrite NEGATIVE NEGATIVE   Leukocytes, UA TRACE (A) NEGATIVE  Urine microscopic-add on     Status: None    Collection Time: 02/19/15 11:11 AM  Result Value Ref Range   WBC, UA 3-6 <3 WBC/hpf   Urine-Other MUCOUS PRESENT     Dg Chest Port 1 View  02/19/2015   CLINICAL DATA:  Pain following fall. Intermittent cough and shortness of breath  EXAM: PORTABLE CHEST - 1 VIEW  COMPARISON:  January 04, 2015  FINDINGS: There is no edema or consolidation. The heart size and pulmonary vascularity are normal. No pneumothorax. No adenopathy. No bone lesions.  IMPRESSION: No edema or consolidation.   Electronically Signed   By: William  Woodruff III M.D.   On: 02/19/2015 10:02   Dg Hip Unilat With Pelvis 2-3 Views Left  02/19/2015   CLINICAL DATA:  Pain following fall earlier today  EXAM: DG HIP (WITH OR WITHOUT PELVIS) 2-3V LEFT  COMPARISON:  None.  FINDINGS: Frontal pelvis as well as frontal and lateral left hip images were obtained. On the frontal view, there is an obliquely oriented lucency in the femoral neck region on the left with apparent cortical disruption along the lateral mid femoral neck region, concerning for incomplete fracture. A fracture is not appreciable on the lateral view, however. There is no other evidence of potential fracture. No dislocation. There is slight narrowing of both hip joints. There is extensive osteoarthritic change in the visualized lumbar spine.  IMPRESSION: Concern for fracture, obliquely oriented, in the femoral neck region, not confirmed on the lateral view. Given this appearance, correlation with CT or MR of the left hip region is advised. No other findings concerning for potential fracture. No dislocation. Mild symmetric narrowing of both hip joints. Degenerative type change in visualized lumbar spine.   Electronically Signed   By: William  Woodruff III M.D.   On: 02/19/2015 09:36   Dg Femur Min 2 Views Left  02/19/2015   CLINICAL DATA:  Fall at home today. Left hip and femur pain. Initial encounter.  EXAM: LEFT FEMUR 2 VIEWS  COMPARISON:  None.  FINDINGS: And intratrochanteric fracture  is nondisplaced. A left total knee arthroplasty is noted. The knee is located. The hip is located.   No additional fractures are present.  IMPRESSION: Nondisplaced intertrochanteric fracture of the left hip.   Electronically Signed   By: Christopher  Mattern M.D.   On: 02/19/2015 09:38    Review of Systems  Constitutional: Negative for fever and chills.  HENT: Negative for hearing loss.   Respiratory: Positive for cough.        Smoked many years ago , quit 49 yrs ago  Cardiovascular: Negative for chest pain and orthopnea.  Gastrointestinal: Negative for heartburn.  Musculoskeletal: Positive for falls. Negative for myalgias.  Neurological: Negative for headaches.  Psychiatric/Behavioral: Positive for memory loss.   Blood pressure 149/81, pulse 83, temperature 98.7 F (37.1 C), temperature source Oral, resp. rate 18, SpO2 98 %. Physical Exam  Constitutional: He appears well-developed and well-nourished.  HENT:  Head: Normocephalic and atraumatic.  Eyes: Pupils are equal, round, and reactive to light.  Neck: Normal range of motion.  Cardiovascular: Normal rate and regular rhythm.   Respiratory: Effort normal. He has no rales. He exhibits no tenderness.  GI: Soft. He exhibits no distension. There is no tenderness.  Musculoskeletal:  Pain with hip ROM left . Pulses intact  Neurological: He is alert. He exhibits normal muscle tone.  Psychiatric:  Dementia. Pleasant , answers questions with some errors.  ( I live with my Mother.......)     Assessment/Plan: Left Nondisplaced IT hip Fx.  RBBB on EKG. Has had echo. Cardiology clearance pending . Will post for surgery late morning tomorrow and cancel if cardiology feels he should not proceed. Risks of surgery discussed with pt , daughter (POA)  And Son -In -Law. They request we proceed if acceptable with cardiology after evaluation.                   MY PHONE IS [phone]  Rethel Sebek C 02/19/2015, 7:33 PM   Patient has Hx of PE. Would  likely use Lovenox while in hospital but after discharge risks of falling with head injury vs risks of DVT and then possible PE will have to be weighed. Both options include risks due to his Hx of dementia and falls. If chemical prophylaxis chosen would go with Warfarin since it can be reversed. Likely will need SNF for 20 days then back home with sitter.   

## 2015-02-20 NOTE — Anesthesia Postprocedure Evaluation (Signed)
  Anesthesia Post-op Note  Patient: Willie Mayer.  Procedure(s) Performed: Procedure(s): LEFT  INTERTROCHANTRIC HIP FRACTURE (Left)  Patient Location: PACU  Anesthesia Type:General  Level of Consciousness: awake and alert   Airway and Oxygen Therapy: Patient Spontanous Breathing  Post-op Pain: Controlled  Post-op Assessment: Post-op Vital signs reviewed, Patient's Cardiovascular Status Stable and Respiratory Function Stable  Post-op Vital Signs: Reviewed  Filed Vitals:   02/20/15 1400  BP: 139/88  Pulse: 102  Temp:   Resp: 11    Complications: No apparent anesthesia complications

## 2015-02-20 NOTE — Brief Op Note (Signed)
02/19/2015 - 02/20/2015  1:19 PM  PATIENT:  Willie Mayer.  81 y.o. male  PRE-OPERATIVE DIAGNOSIS:  left intertrochanteric hip fracture  POST-OPERATIVE DIAGNOSIS:  left intertrochanteric hip fracture  PROCEDURE:  Procedure(s): LEFT  INTERTROCHANTRIC HIP FRACTURE (Left)  SURGEON:  Surgeon(s) and Role:    * Eldred Manges, MD - Primary  PHYSICIAN ASSISTANT:   ASSISTANTS: none   ANESTHESIA:   local and general  EBL:  Total I/O In: 0  Out: 400 [Urine:300; Blood:100]  BLOOD ADMINISTERED:none  DRAINS: none   LOCAL MEDICATIONS USED:  MARCAINE     SPECIMEN:  No Specimen  DISPOSITION OF SPECIMEN:  N/A  COUNTS:  YES  TOURNIQUET:  * No tourniquets in log *  DICTATION: .Other Dictation: Dictation Number [id number]  PLAN OF CARE: already inpatient  PATIENT DISPOSITION:  PACU - hemodynamically stable.   Delay start of Pharmacological VTE agent (>24hrs) due to surgical blood loss or risk of bleeding: yes

## 2015-02-20 NOTE — Transfer of Care (Signed)
Immediate Anesthesia Transfer of Care Note  Patient: Willie Mayer.  Procedure(s) Performed: Procedure(s): LEFT  INTERTROCHANTRIC HIP FRACTURE (Left)  Patient Location: PACU  Anesthesia Type:General  Level of Consciousness: awake and patient cooperative  Airway & Oxygen Therapy: Patient Spontanous Breathing and Patient connected to face mask oxygen  Post-op Assessment: Report given to RN and Post -op Vital signs reviewed and stable  Post vital signs: Reviewed and stable  Last Vitals:  Filed Vitals:   02/20/15 1124  BP: 127/67  Pulse: 65  Temp: 36.8 C  Resp: 16    Complications: No apparent anesthesia complications

## 2015-02-20 NOTE — Progress Notes (Addendum)
Patient presents with mechanical fall, with left hip fracture, [place] for surgical intervention.  seen and examined:  Awake alert CTA B/L Regular rate and rhythm, no rubs gallops Abdomen soft nontender Stenting of edema clubbing no cyanosis  Left hip fracture - For surgical repair today by Dr. Ophelia Charter. - Moderate risk for surgical intervention.  History of CVA - Resume Plavix after surgery.  Glaucoma - Continue with home medication  Alzheimer's disease - Continue with Namenda  Depression - Continue with Celexa  Hyperlipidemia - Continue with statin  Patient echo showing EF 60-65%, patient is moderate risk for surgery given his history of CVA, will be seen by cardiology prior to surgery . Huey Bienenstock MD

## 2015-02-20 NOTE — Op Note (Signed)
[name], [name] NO.:  [id number]  MEDICAL RECORD NO.:  [id number]  LOCATION:  [facility]                         FACILITY[facility]  [facility]  PHYSICIAN:  Angele Wiemann C. Ophelia Charter, M.D.    DATE OF BIRTH:  [birth year]  DATE OF PROCEDURE:  02/20/2015 DATE OF DISCHARGE:                              OPERATIVE REPORT   PREOPERATIVE DIAGNOSIS:  Nondisplaced left intertrochanteric fracture.  POSTOPERATIVE DIAGNOSIS:  Nondisplaced left intertrochanteric fracture.  PROCEDURE PERFORMED:  Left Affixus troch nail short 11 mm diameter, 105 lag screw, 42 mm interlock screw.  SURGEON:  Cris Talavera C. Ophelia Charter, MD.  ESTIMATED BLOOD LOSS:  Minimal.  COMPLICATIONS:  None.  PROCEDURE IN DETAIL:  After standard prepping and draping, the patient was placed on the fracture table.  Careful positioning internal rotation was performed.  Area squared with towels, Betadine, Steri-Drape, shower curtain was applied and C-arm had been used to confirm retained excellent position and alignment.  Time-out procedure was completed. Ancef prophylaxis was given.  Incision was made proximal.  Trochanteric pin was placed, overreamed, short nail was advanced.  C-arm was checked intermittently until the screw was low on the neck initially with pin placement, it was slightly too far posterior in the head, it was adjusted so sort of posterior center and measured at 110, 105 screw was selected, inserted, tightened down, checked on 2 views, locked down securely and then transverse distal interlock was used.  The 42 mm screw was used.  Final spot pictures were taken, in good position alignment. Irrigation closure with deep layer #1 Vicryl, 2-0 Vicryl subcutaneous tissue, skin staple closure after irrigation.  Marcaine was infiltrated in the skin.  The patient was transferred to the recovery room. Instrument count and needle count was correct.     Willie Mayer C. Ophelia Charter, M.D.     MCY/MEDQ  D:  02/20/2015  T:  02/20/2015  Job:   [id number]

## 2015-02-20 NOTE — Anesthesia Preprocedure Evaluation (Addendum)
Anesthesia Evaluation  Patient identified by MRN, date of birth, ID band Patient awake    Reviewed: Allergy & Precautions, H&P , NPO status , Patient's Chart, lab work & pertinent test results  Airway Mallampati: II  TM Distance: >3 FB Neck ROM: Full    Dental no notable dental hx. (+) Teeth Intact, Dental Advisory Given   Pulmonary neg pulmonary ROS, former smoker,  breath sounds clear to auscultation  Pulmonary exam normal       Cardiovascular hypertension, + dysrhythmias Rhythm:Regular Rate:Normal     Neuro/Psych Depression Dementia CVA    GI/Hepatic negative GI ROS, Neg liver ROS,   Endo/Other  negative endocrine ROS  Renal/GU negative Renal ROS  negative genitourinary   Musculoskeletal   Abdominal   Peds  Hematology negative hematology ROS (+)   Anesthesia Other Findings   Reproductive/Obstetrics negative OB ROS                            Anesthesia Physical Anesthesia Plan  ASA: III  Anesthesia Plan: General   Post-op Pain Management:    Induction: Intravenous  Airway Management Planned: Oral ETT  Additional Equipment:   Intra-op Plan:   Post-operative Plan: Extubation in OR  Informed Consent: I have reviewed the patients History and Physical, chart, labs and discussed the procedure including the risks, benefits and alternatives for the proposed anesthesia with the patient or authorized representative who has indicated his/her understanding and acceptance.   Dental advisory given  Plan Discussed with: CRNA  Anesthesia Plan Comments:         Anesthesia Quick Evaluation

## 2015-02-20 NOTE — Anesthesia Procedure Notes (Signed)
Procedure Name: Intubation Date/Time: 02/20/2015 12:26 PM Performed by: Delphia Grates Pre-anesthesia Checklist: Patient identified, Emergency Drugs available, Suction available and Patient being monitored Patient Re-evaluated:Patient Re-evaluated prior to inductionOxygen Delivery Method: Circle system utilized Preoxygenation: Pre-oxygenation with 100% oxygen Intubation Type: IV induction Laryngoscope Size: Mac and 4 Grade View: Grade I Tube type: Oral Tube size: 7.5 mm Number of attempts: 1 Airway Equipment and Method: Stylet Placement Confirmation: ETT inserted through vocal cords under direct vision,  breath sounds checked- equal and bilateral and positive ETCO2 Secured at: 22 cm Tube secured with: Tape Dental Injury: Teeth and Oropharynx as per pre-operative assessment  Comments: Rigid neck

## 2015-02-20 NOTE — Consult Note (Addendum)
Admit date: 02/19/2015 Referring Physician  Dr. Randol Kern Primary Physician  Dr. Juluis Rainier Primary Cardiologist  None Reason for Consultation  Preoperative cardiac clearance for hip surgery  HPI: Armarion Greek is a 81 y.o. male, past medical history significant for history CVA, alzheimers dementia , history DVT/PE ,hyperlipidemia who lives at home with his wife with 24 hour care, ambulates with no assistance for short distance, walker for longer distance in-home, presented with a fall while going to the bathroom and then complained of  left hip pain.  Patient had ground-level fall while standing with his walker.  He was laying on the floor for 3 hours before EMS were called by home aid, hip x-ray significant for nondisplaced intertrochanteric femur fracture.  Patient denied any chest pain, shortness of breath, fever, chills.  Admit EKG showed new RBBB, and LAFB.  2D echo was done showing normal LVF with mild LVF and no valvular disease.  Cardiology is now asked to see for preop clearance.  He denies any chest pain, SOB, DOE.  Prior to fall he was able to ambulate in his house and do daily activities without any cardiac symptoms.     PMH:   Past Medical History  Diagnosis Date  . Dementia   . Hypertension   . History of CVA (cerebrovascular accident)   . History of DVT (deep vein thrombosis)   . History of pulmonary embolism   . Depression   . Glaucoma   . Urge incontinence   . BPH (benign prostatic hyperplasia)     s/p surgery that "didnt work"  . Stroke   . Shortness of breath dyspnea      PSH:   Past Surgical History  Procedure Laterality Date  . Replacement total knee bilateral    . Bph surgery    . Joint replacement      Allergies:  Review of patient's allergies indicates no known allergies. Prior to Admit Meds:   Prescriptions prior to admission  Medication Sig Dispense Refill Last Dose  . brimonidine-timolol (COMBIGAN) 0.2-0.5 % ophthalmic solution Place 1 drop  into both eyes every 12 (twelve) hours.   02/18/2015 at Unknown time  . brinzolamide (AZOPT) 1 % ophthalmic suspension Place 1 drop into the right eye 2 (two) times daily.   02/18/2015 at Unknown time  . citalopram (CELEXA) 40 MG tablet Take 40 mg by mouth daily.   02/18/2015 at Unknown time  . clopidogrel (PLAVIX) 75 MG tablet Take 1 tablet (75 mg total) by mouth daily. 90 tablet 3 02/18/2015 at Unknown time  . Ergocalciferol 400 UNITS TABS Take 400 Units by mouth daily. 90 tablet 3 02/18/2015 at Unknown time  . memantine (NAMENDA) 10 MG tablet TAKE ONE TABLET BY MOUTH TWICE DAILY 60 tablet 0 02/18/2015 at Unknown time  . mirabegron ER (MYRBETRIQ) 50 MG TB24 tablet Take 50 mg by mouth daily.   02/18/2015 at Unknown time  . Multiple Vitamin (MULTIVITAMIN WITH MINERALS) TABS tablet Take 1 tablet by mouth daily.   02/18/2015 at Unknown time  . pravastatin (PRAVACHOL) 20 MG tablet TAKE ONE TABLET BY MOUTH ONCE DAILY 90 tablet 0 02/18/2015 at Unknown time  . tamsulosin (FLOMAX) 0.4 MG CAPS capsule Take 0.4 mg by mouth every morning.   02/18/2015 at Unknown time   Fam HX:    Family History  Problem Relation Age of Onset  . Alcoholism Father     died in 108s related to complciations  . Heart failure Mother   .  Arrhythmia Mother    Social HX:    History   Social History  . Marital Status: Married    Spouse Name: N/A  . Number of Children: N/A  . Years of Education: N/A   Occupational History  . Insurance     Social History Main Topics  . Smoking status: Former Smoker -- 15.00 packs/day    Quit date: 07/17/1965  . Smokeless tobacco: Never Used  . Alcohol Use: Yes     Comment: occasional gin and tonic  . Drug Use: No  . Sexual Activity: No   Other Topics Concern  . Not on file   Social History Narrative   Recently moved from [place] where he lived with his wife who has parkinsonian dementia. They were independent but moved down here after recent fall and knowledge of dementia. Live in independent living  facility and indendent in ADLs.       Currently living at [facility] living. Lives with his wife. They help with medication delivery and activities of daily life.      ROS:  All 11 ROS were addressed and are negative except what is stated in the HPI  Physical Exam: Blood pressure 129/72, pulse 70, temperature 98.3 F (36.8 C), temperature source Oral, resp. rate 18, SpO2 97 %.    General: Well developed, well nourished, in no acute distress Head: Eyes PERRLA, No xanthomas.   Normal cephalic and atramatic  Lungs:   Clear bilaterally to auscultation and percussion. Heart:   HRRR S1 S2 Pulses are 2+ & equal. Abdomen: Bowel sounds are positive, abdomen soft and non-tender without masses  Extremities:   No clubbing, cyanosis or edema.  DP +1 Neuro: Alert and oriented X 3. Psych:  Good affect, responds appropriately    Labs:   Lab Results  Component Value Date   WBC 15.3* 02/19/2015   HGB 13.3 02/19/2015   HCT 39.4 02/19/2015   MCV 89.5 02/19/2015   PLT 159 02/19/2015    Recent Labs Lab 02/19/15 0946  NA 137  K 4.5  CL 103  CO2 27  BUN 18  CREATININE 1.21  CALCIUM 9.2  GLUCOSE 147*   No results found for: PTT Lab Results  Component Value Date   INR 1.12 02/19/2015   Lab Results  Component Value Date   TROPONINI <0.30 03/10/2013    No results found for: CHOL No results found for: HDL No results found for: LDLCALC No results found for: TRIG No results found for: CHOLHDL No results found for: LDLDIRECT    Radiology:  Dg Chest Port 1 View  02/19/2015   CLINICAL DATA:  Pain following fall. Intermittent cough and shortness of breath  EXAM: PORTABLE CHEST - 1 VIEW  COMPARISON:  January 04, 2015  FINDINGS: There is no edema or consolidation. The heart size and pulmonary vascularity are normal. No pneumothorax. No adenopathy. No bone lesions.  IMPRESSION: No edema or consolidation.   Electronically Signed   By: Bretta Bang III M.D.   On: 02/19/2015 10:02     Dg Hip Unilat With Pelvis 2-3 Views Left  02/19/2015   CLINICAL DATA:  Pain following fall earlier today  EXAM: DG HIP (WITH OR WITHOUT PELVIS) 2-3V LEFT  COMPARISON:  None.  FINDINGS: Frontal pelvis as well as frontal and lateral left hip images were obtained. On the frontal view, there is an obliquely oriented lucency in the femoral neck region on the left with apparent cortical disruption along the lateral mid  femoral neck region, concerning for incomplete fracture. A fracture is not appreciable on the lateral view, however. There is no other evidence of potential fracture. No dislocation. There is slight narrowing of both hip joints. There is extensive osteoarthritic change in the visualized lumbar spine.  IMPRESSION: Concern for fracture, obliquely oriented, in the femoral neck region, not confirmed on the lateral view. Given this appearance, correlation with CT or MR of the left hip region is advised. No other findings concerning for potential fracture. No dislocation. Mild symmetric narrowing of both hip joints. Degenerative type change in visualized lumbar spine.   Electronically Signed   By: Bretta Bang III M.D.   On: 02/19/2015 09:36   Dg Femur Min 2 Views Left  02/19/2015   CLINICAL DATA:  Fall at home today. Left hip and femur pain. Initial encounter.  EXAM: LEFT FEMUR 2 VIEWS  COMPARISON:  None.  FINDINGS: And intratrochanteric fracture is nondisplaced. A left total knee arthroplasty is noted. The knee is located. The hip is located. No additional fractures are present.  IMPRESSION: Nondisplaced intertrochanteric fracture of the left hip.   Electronically Signed   By: Marin Roberts M.D.   On: 02/19/2015 09:38    EKG:  NSR with RBBB and LAFB  ASSESSMENT/PLAN: 1.  RBBB with LAFB - 2D echo with normal LVF with mild LVH.  Prior EKGs show LAFB and nonspecific IVCD and RBBB is new since 12/2014.  He has no history of CAD and denies any history of chest pain, SOB, DOE, LE edema but is a  poor historian due to his underlying dementia.  Unknown whether new conduction abnormality due to underlying conduction system disease from aging or from underlying ischemic heart disease.  Normal LVF is reassuring.  I think he is  moderate risk from cardiac standpoint for a low to moderate risk short procedure and recommend proceeding with hip surgery.  Follow on tele post op for any bradyarrhythmias.  Would avoid BB use given bifascicular block on EKG.  May want to consider changing his Timoptic eye gtts to something else since it can be systemically absorbed. 2.  Hip fracture 3.  HTN - controlled 4.  Dementia     Quintella Reichert, MD  02/20/2015  9:20 AM

## 2015-02-20 NOTE — Interval H&P Note (Signed)
History and Physical Interval Note:  02/20/2015 12:03 PM  Willie Mayer.  has presented today for surgery, with the diagnosis of left intertrochanteric hip fracture  The various methods of treatment have been discussed with the patient and family. After consideration of risks, benefits and other options for treatment, the patient has consented to  Procedure(s): LEFT  INTERTROCHANTRIC HIP FRACTURE (Left) as a surgical intervention .  The patient's history has been reviewed, patient examined, no change in status, stable for surgery.  I have reviewed the patient's chart and labs.  Questions were answered to the patient's satisfaction.     Khya Halls C

## 2015-02-20 NOTE — Progress Notes (Signed)
ANTICOAGULATION CONSULT NOTE - Initial Consult  Pharmacy Consult for warfarin  Indication: VTE prophylaxis  No Known Allergies  Patient Measurements:   Heparin Dosing Weight:   Vital Signs: Temp: 98.6 F (37 C) (08/06 1425) Temp Source: Oral (08/06 1124) BP: 146/86 mmHg (08/06 1425) Pulse Rate: 99 (08/06 1425)  Labs:  Recent Labs  02/19/15 0946  HGB 13.3  HCT 39.4  PLT 159  LABPROT 14.6  INR 1.12  CREATININE 1.21    CrCl cannot be calculated (Unknown ideal weight.).   Medical History: Past Medical History  Diagnosis Date  . Dementia   . Hypertension   . History of CVA (cerebrovascular accident)   . History of DVT (deep vein thrombosis)   . History of pulmonary embolism   . Depression   . Glaucoma   . Urge incontinence   . BPH (benign prostatic hyperplasia)     s/p surgery that "didnt work"  . Stroke   . Shortness of breath dyspnea     Medications:  Scheduled:  . brimonidine  1 drop Both Eyes Q12H  . brinzolamide  1 drop Right Eye BID  . cefTRIAXone (ROCEPHIN)  IV  1 g Intravenous Q24H  . citalopram  20 mg Oral Daily  . docusate sodium  100 mg Oral BID  . [START ON 02/21/2015] enoxaparin (LOVENOX) injection  30 mg Subcutaneous Q24H  . fentaNYL      . memantine  10 mg Oral BID  . mirabegron ER  50 mg Oral Daily  . multivitamin with minerals  1 tablet Oral Daily  . pravastatin  20 mg Oral Daily  . tamsulosin  0.4 mg Oral q morning - 10a  . timolol  1 drop Both Eyes BID  . warfarin  7.5 mg Oral ONCE-1800    Assessment: Pt is a 81 yo male s/p hip fracture surgery. Pt is a fall risk due to dementia, therefore warfarin being used due to reversibility. Pt has a history of DVT/PE. Pt has also been ordered enoxaparin 30 mg SQ daily due to start at 8/7, ordered to d/c once INR is >/= 1.8. Daily PT/INR have been ordered. CBC ordered daily x 3 days.  Goal of Therapy:  INR 2-3 Monitor platelets by anticoagulation protocol: Yes   Plan:  Will start warfarin  7.5 mg PO x 1 for 8/6.   Shana Chute Gavinn Collard 02/20/15 15:22

## 2015-02-21 DIAGNOSIS — S72001A Fracture of unspecified part of neck of right femur, initial encounter for closed fracture: Secondary | ICD-10-CM

## 2015-02-21 LAB — BASIC METABOLIC PANEL
Anion gap: 3 — ABNORMAL LOW (ref 5–15)
BUN: 19 mg/dL (ref 6–20)
CO2: 25 mmol/L (ref 22–32)
CREATININE: 1.01 mg/dL (ref 0.61–1.24)
Calcium: 8.4 mg/dL — ABNORMAL LOW (ref 8.9–10.3)
Chloride: 107 mmol/L (ref 101–111)
GFR calc Af Amer: >60 mL/min (ref >60)
GFR calc non Af Amer: >60 mL/min (ref >60)
GLUCOSE: 144 mg/dL — AB (ref 65–99)
Potassium: 4.4 mmol/L (ref 3.5–5.1)
Sodium: 135 mmol/L (ref 135–145)

## 2015-02-21 LAB — PROTIME-INR
INR: 1.26 (ref 0.00–1.49)
Prothrombin Time: 16 seconds — ABNORMAL HIGH (ref 11.6–15.2)

## 2015-02-21 LAB — CBC
HCT: 35.2 % — ABNORMAL LOW (ref 39.0–52.0)
HEMOGLOBIN: 11.9 g/dL — AB (ref 13.0–17.0)
MCH: 30.4 pg (ref 26.0–34.0)
MCHC: 33.8 g/dL (ref 30.0–36.0)
MCV: 89.8 fL (ref 78.0–100.0)
PLATELETS: 145 10*3/uL — AB (ref 150–400)
RBC: 3.92 MIL/uL — ABNORMAL LOW (ref 4.22–5.81)
RDW: 13.1 % (ref 11.5–15.5)
WBC: 13.5 10*3/uL — AB (ref 4.0–10.5)

## 2015-02-21 MED ORDER — WARFARIN SODIUM 7.5 MG PO TABS
7.5000 mg | ORAL_TABLET | Freq: Once | ORAL | Status: DC
  Filled 2015-02-21: qty 1

## 2015-02-21 MED ORDER — CLOPIDOGREL BISULFATE 75 MG PO TABS
75.0000 mg | ORAL_TABLET | Freq: Every day | ORAL | Status: DC
Start: 2015-02-22 — End: 2015-02-22
  Administered 2015-02-22: 75 mg via ORAL
  Filled 2015-02-21: qty 1

## 2015-02-21 NOTE — Progress Notes (Signed)
Subjective: 1 Day Post-Op Procedure(s) (LRB): LEFT  INTERTROCHANTRIC HIP FRACTURE (Left) Patient reports pain as mild.    Objective: Vital signs in last 24 hours: Temp:  [97.3 F (36.3 C)-98.6 F (37 C)] 97.3 F (36.3 C) (08/07 1000) Pulse Rate:  [72-103] 72 (08/07 1000) Resp:  [11-20] 16 (08/07 1000) BP: (107-149)/(43-92) 120/65 mmHg (08/07 1000) SpO2:  [94 %-100 %] 96 % (08/07 1000)  Intake/Output from previous day: 08/06 0701 - 08/07 0700 In: 1930 [P.O.:480; I.V.:1450] Out: 400 [Urine:300; Blood:100] Intake/Output this shift: Total I/O In: 605 [I.V.:555; IV Piggyback:50] Out: -    Recent Labs  02/19/15 0946 02/20/15 1509 02/21/15 0512  HGB 13.3 13.4 11.9*    Recent Labs  02/20/15 1509 02/21/15 0512  WBC 14.9* 13.5*  RBC 4.42 3.92*  HCT 40.1 35.2*  PLT 146* 145*    Recent Labs  02/19/15 0946 02/20/15 1509 02/21/15 0512  NA 137  --  135  K 4.5  --  4.4  CL 103  --  107  CO2 27  --  25  BUN 18  --  19  CREATININE 1.21 1.03 1.01  GLUCOSE 147*  --  144*  CALCIUM 9.2  --  8.4*    Recent Labs  02/19/15 0946 02/21/15 0512  INR 1.12 1.26    Neurologically intact  Assessment/Plan: 1 Day Post-Op Procedure(s) (LRB): LEFT  INTERTROCHANTRIC HIP FRACTURE (Left) Up with therapy   SNF  Hutch Rhett C 02/21/2015, 11:38 AM

## 2015-02-21 NOTE — Progress Notes (Signed)
ANTICOAGULATION CONSULT NOTE - Follow Up  Pharmacy Consult for warfarin  Indication: VTE prophylaxis  No Known Allergies  Patient Measurements:    Vital Signs: Temp: 97.3 F (36.3 C) (08/07 1000) Temp Source: Oral (08/07 1000) BP: 120/65 mmHg (08/07 1000) Pulse Rate: 72 (08/07 1000)  Labs:  Recent Labs  02/19/15 0946 02/20/15 1509 02/21/15 0512  HGB 13.3 13.4 11.9*  HCT 39.4 40.1 35.2*  PLT 159 146* 145*  LABPROT 14.6  --  16.0*  INR 1.12  --  1.26  CREATININE 1.21 1.03 1.01    CrCl cannot be calculated (Unknown ideal weight.).   Medical History: Past Medical History  Diagnosis Date  . Dementia   . Hypertension   . History of CVA (cerebrovascular accident)   . History of DVT (deep vein thrombosis)   . History of pulmonary embolism   . Depression   . Glaucoma   . Urge incontinence   . BPH (benign prostatic hyperplasia)     s/p surgery that "didnt work"  . Stroke   . Shortness of breath dyspnea     Assessment: 89 yoM admitted after fall at home resulting in hip fracture now s/p hip fracture surgery 8/6. Pt is a fall risk due to dementia, therefore warfarin being used due to reversibility. Pt has a history of DVT/PE but was not on any anticoagulation PTA. Pt was on Plavix PTA for history of CVA, currently held.  Lovenox 30 mg SQ daily ordered post-op until INR is >/= 1.8.    Today, 02/21/2015:  INR 1.26.  Warfarin started 8/6.  Hgb 11.9, platelets 145K.   Goal of Therapy:  INR 2-3 Monitor platelets by anticoagulation protocol: Yes   Plan:  1.  Warfarin 7.5 mg once today. 2.  Daily INR.  Clance Boll, PharmD, BCPS Pager: [phone] 02/21/2015 1:47 PM

## 2015-02-21 NOTE — Progress Notes (Signed)
Pt tol clear liquid diet.cbg at 2030 was 161.dtr Diane would like possible A1C drawn and discussion with physician before any sliding scale or insulin started.Sticky note left for physician.stated pt never dx to be diabetic.Linward Headland D

## 2015-02-21 NOTE — Progress Notes (Addendum)
Patient Demographics  Robbie Rideaux, is a 81 y.o. male, DOB - October 12, 1932, OZH:[id number]  Admit date - 02/19/2015   Admitting Physician Starleen Arms, MD  Outpatient Primary MD for the patient is Gaye Alken, MD  LOS - 2   Chief Complaint  Patient presents with  . Hip Pain  . Fall       Admission HPI/Brief narrative: 81 year old with history of CVA, Alzheimer's dementia, DVT/PE, hyperlipidemia, presents with mechanical fall, sustained left hip fracture, status post surgical repair by Dr. Ophelia Charter on 02/20/15. Subjective:   Armon Orvis today has, No headache, No chest pain, No abdominal pain - No Nausea, No new weakness tingling or numbness, No Cough - SOB. Patient reports mild left hip pain.  Assessment & Plan    Active Problems:   Dementia with behavioral disturbance   Hypertension   History of CVA (cerebrovascular accident)   Depression   Glaucoma   Alzheimer's disease   Hip fracture   RBBB (right bundle branch block with left anterior fascicular block)   Preoperative clearance  Left hip fracture - Patient with fall, nondisplaced intertrochanteric hip fracture, status post surgical repair by Dr. Ophelia Charter on 02/20/15 - On when necessary pain medication, and Lovenox for DVT prophylaxis - See and hear by cardiology giving his new RBBB and LAFB in EKG - will need SNF placment  History of CVA - We'll resume back on Plavix, discussed with orthopedic  Glaucoma - Continue with home medication  Alzheimer's disease - Continue with Namenda  Depression - Continue with Celexa  Hyperlipidemia - Continue with statin   Code Status:  DNR  Family Communication: None at bedside  Disposition Plan: SNF   Procedures  Surgical repair of left hip fracture by Dr. Ophelia Charter 8/6   Consults   Orthopedics   Medications  Scheduled Meds: . brimonidine  1 drop Both Eyes Q12H  .  brinzolamide  1 drop Right Eye BID  . cefTRIAXone (ROCEPHIN)  IV  1 g Intravenous Q24H  . citalopram  20 mg Oral Daily  . docusate sodium  100 mg Oral BID  . enoxaparin (LOVENOX) injection  30 mg Subcutaneous Q24H  . memantine  10 mg Oral BID  . mirabegron ER  50 mg Oral Daily  . multivitamin with minerals  1 tablet Oral Daily  . pravastatin  20 mg Oral Daily  . tamsulosin  0.4 mg Oral q morning - 10a  . timolol  1 drop Both Eyes BID  . warfarin  7.5 mg Oral ONCE-1800  . Warfarin - Pharmacist Dosing Inpatient   Does not apply q1800   Continuous Infusions: . sodium chloride 50 mL/hr (02/21/15 0620)   PRN Meds:.acetaminophen **OR** acetaminophen, bisacodyl, bisacodyl, fentaNYL (SUBLIMAZE) injection, HYDROcodone-acetaminophen, HYDROcodone-acetaminophen, menthol-cetylpyridinium **OR** phenol, metoCLOPramide **OR** metoCLOPramide (REGLAN) injection, morphine injection, morphine injection, ondansetron **OR** ondansetron (ZOFRAN) IV, senna-docusate   DVT Prophylaxis  Lovenox -  Lab Results  Component Value Date   PLT 145* 02/21/2015    Antibiotics    Anti-infectives    Start     Dose/Rate Route Frequency Ordered Stop   02/20/15 1000  ceFAZolin (ANCEF) IVPB 2 g/50 mL premix     2 g 100 mL/hr over 30 Minutes Intravenous 30 min pre-op 02/20/15 0708  02/20/15 1240   02/20/15 1000  cefTRIAXone (ROCEPHIN) 1 g in dextrose 5 % 50 mL IVPB     1 g 100 mL/hr over 30 Minutes Intravenous Every 24 hours 02/20/15 0856            Objective:   Filed Vitals:   02/21/15 0215 02/21/15 0600 02/21/15 1000 02/21/15 1439  BP: 107/43 116/71 120/65 101/68  Pulse: 76 72 72 70  Temp: 97.6 F (36.4 C) 97.4 F (36.3 C) 97.3 F (36.3 C) 98.1 F (36.7 C)  TempSrc: Oral Oral Oral Oral  Resp: 16 20 16 14   SpO2: 94% 95% 96% 96%    Wt Readings from Last 3 Encounters:  01/12/15 110.768 kg (244 lb 3.2 oz)  05/13/14 110.315 kg (243 lb 3.2 oz)  11/12/13 109.77 kg (242 lb)     Intake/Output Summary  (Last 24 hours) at 02/21/15 1634 Last data filed at 02/21/15 1400  Gross per 24 hour  Intake   2560 ml  Output     50 ml  Net   2510 ml     Physical Exam  Awake Alert,  [place].AT,PERRAL Supple Neck,No JVD,   Symmetrical Chest wall movement, Good air movement bilaterally, CTAB RRR,No Gallops,Rubs or new Murmurs,  +ve B.Sounds, Abd Soft, No tenderness, No organomegaly appriciated, No Cyanosis, Clubbing or edema, No new Rash or bruise     Data Review   Micro Results No results found for this or any previous visit (from the past 240 hour(s)).  Radiology Reports Dg Chest Port 1 View  02/19/2015   CLINICAL DATA:  Pain following fall. Intermittent cough and shortness of breath  EXAM: PORTABLE CHEST - 1 VIEW  COMPARISON:  January 04, 2015  FINDINGS: There is no edema or consolidation. The heart size and pulmonary vascularity are normal. No pneumothorax. No adenopathy. No bone lesions.  IMPRESSION: No edema or consolidation.   Electronically Signed   By: Bretta Bang III M.D.   On: 02/19/2015 10:02   Dg Hip Operative Unilat With Pelvis Left  02/20/2015   CLINICAL DATA:  ORIF of a left proximal femur fracture.  EXAM: OPERATIVE LEFT HIP (WITH PELVIS IF PERFORMED)  VIEWS  TECHNIQUE: Fluoroscopic spot image(s) were submitted for interpretation post-operatively.  COMPARISON:  02/19/2015  FINDINGS: Femoral neck fracture has been stable lies with a compression screw and intra medullary rod. The orthopedic hardware well-seated. Fracture fragments are well aligned. There is no new fracture or evidence of an operative complication.  IMPRESSION: Well-aligned left proximal femur fracture fragments following ORIF.   Electronically Signed   By: Amie Portland M.D.   On: 02/20/2015 13:28   Dg Hip Unilat With Pelvis 2-3 Views Left  02/19/2015   CLINICAL DATA:  Pain following fall earlier today  EXAM: DG HIP (WITH OR WITHOUT PELVIS) 2-3V LEFT  COMPARISON:  None.  FINDINGS: Frontal pelvis as well as frontal and  lateral left hip images were obtained. On the frontal view, there is an obliquely oriented lucency in the femoral neck region on the left with apparent cortical disruption along the lateral mid femoral neck region, concerning for incomplete fracture. A fracture is not appreciable on the lateral view, however. There is no other evidence of potential fracture. No dislocation. There is slight narrowing of both hip joints. There is extensive osteoarthritic change in the visualized lumbar spine.  IMPRESSION: Concern for fracture, obliquely oriented, in the femoral neck region, not confirmed on the lateral view. Given this appearance, correlation with CT or  MR of the left hip region is advised. No other findings concerning for potential fracture. No dislocation. Mild symmetric narrowing of both hip joints. Degenerative type change in visualized lumbar spine.   Electronically Signed   By: Bretta Bang III M.D.   On: 02/19/2015 09:36   Dg Femur Min 2 Views Left  02/19/2015   CLINICAL DATA:  Fall at home today. Left hip and femur pain. Initial encounter.  EXAM: LEFT FEMUR 2 VIEWS  COMPARISON:  None.  FINDINGS: And intratrochanteric fracture is nondisplaced. A left total knee arthroplasty is noted. The knee is located. The hip is located. No additional fractures are present.  IMPRESSION: Nondisplaced intertrochanteric fracture of the left hip.   Electronically Signed   By: Marin Roberts M.D.   On: 02/19/2015 09:38     CBC  Recent Labs Lab 02/19/15 0946 02/20/15 1509 02/21/15 0512  WBC 15.3* 14.9* 13.5*  HGB 13.3 13.4 11.9*  HCT 39.4 40.1 35.2*  PLT 159 146* 145*  MCV 89.5 90.7 89.8  MCH 30.2 30.3 30.4  MCHC 33.8 33.4 33.8  RDW 13.1 13.2 13.1  LYMPHSABS 1.2  --   --   MONOABS 0.6  --   --   EOSABS 0.1  --   --   BASOSABS 0.0  --   --     Chemistries   Recent Labs Lab 02/19/15 0946 02/20/15 1509 02/21/15 0512  NA 137  --  135  K 4.5  --  4.4  CL 103  --  107  CO2 27  --  25    GLUCOSE 147*  --  144*  BUN 18  --  19  CREATININE 1.21 1.03 1.01  CALCIUM 9.2  --  8.4*   ------------------------------------------------------------------------------------------------------------------ CrCl cannot be calculated (Unknown ideal weight.). ------------------------------------------------------------------------------------------------------------------ No results for input(s): HGBA1C in the last 72 hours. ------------------------------------------------------------------------------------------------------------------ No results for input(s): CHOL, HDL, LDLCALC, TRIG, CHOLHDL, LDLDIRECT in the last 72 hours. ------------------------------------------------------------------------------------------------------------------ No results for input(s): TSH, T4TOTAL, T3FREE, THYROIDAB in the last 72 hours.  Invalid input(s): FREET3 ------------------------------------------------------------------------------------------------------------------ No results for input(s): VITAMINB12, FOLATE, FERRITIN, TIBC, IRON, RETICCTPCT in the last 72 hours.  Coagulation profile  Recent Labs Lab 02/19/15 0946 02/21/15 0512  INR 1.12 1.26    No results for input(s): DDIMER in the last 72 hours.  Cardiac Enzymes No results for input(s): CKMB, TROPONINI, MYOGLOBIN in the last 168 hours.  Invalid input(s): CK ------------------------------------------------------------------------------------------------------------------ Invalid input(s): POCBNP     Time Spent in minutes   25 minutes   Loretta Doutt M.D on 02/21/2015 at 4:34 PM  Between 7am to 7pm - Pager - [phone]  After 7pm go to [email or web address] - password [facility]  [facility]   Office  [phone]

## 2015-02-21 NOTE — Progress Notes (Signed)
Physical Therapy Treatment Patient Details Name: Willie Mayer. MRN: [id number] DOB: Apr 21, 1933 Today's Date: 02/21/2015    History of Present Illness Pt s/p fall with nondisplaced L hip fx s/p nailing.  Pt with hx of falls, CVA  s/p 2.5 yrs and dementia.    PT Comments    Pt continues cooperative but with limited endurance and requiring frequent VC for focus on task.  Follow Up Recommendations  SNF     Equipment Recommendations  None recommended by PT    Recommendations for Other Services OT consult     Precautions / Restrictions Precautions Precautions: Fall Restrictions Weight Bearing Restrictions: No Other Position/Activity Restrictions: WBAT    Mobility  Bed Mobility Overal bed mobility: Needs Assistance;+2 for physical assistance;+ 2 for safety/equipment Bed Mobility: Sit to Supine     Supine to sit: +2 for physical assistance;+2 for safety/equipment;Min assist;Mod assist     General bed mobility comments: cues for sequence and use of R LE to self assist; physical assist to manage L LE and to bring trunk to upright  Transfers Overall transfer level: Needs assistance Equipment used: Rolling walker (2 wheeled) Transfers: Sit to/from Stand Sit to Stand: Min assist;Mod assist;+2 physical assistance;+2 safety/equipment;From elevated surface         General transfer comment: cues for LE management and use of UEs to self assist.  Physical assist to bring wt up and steady in standing  Ambulation/Gait Ambulation/Gait assistance: Min assist;Mod assist Ambulation Distance (Feet): 32 Feet Assistive device: Rolling walker (2 wheeled) Gait Pattern/deviations: Step-to pattern;Decreased step length - right;Shuffle;Trunk flexed Gait velocity: decr   General Gait Details: cues for sequence, posture, position from RW and increased UE WB   Stairs            Wheelchair Mobility    Modified Rankin (Stroke Patients Only)       Balance Overall balance  assessment: Needs assistance Sitting-balance support: No upper extremity supported Sitting balance-Leahy Scale: Fair     Standing balance support: Bilateral upper extremity supported Standing balance-Leahy Scale: Poor                      Cognition Arousal/Alertness: Awake/alert Behavior During Therapy: WFL for tasks assessed/performed Overall Cognitive Status: History of cognitive impairments - at baseline Area of Impairment: Safety/judgement;Problem solving;Memory     Memory: Decreased short-term memory   Safety/Judgement: Decreased awareness of safety;Decreased awareness of deficits   Problem Solving: Slow processing      Exercises General Exercises - Lower Extremity Ankle Circles/Pumps: AROM;Both;15 reps;Supine Heel Slides: AAROM;Left;15 reps;Supine Hip ABduction/ADduction: AAROM;Left;15 reps;Supine    General Comments        Pertinent Vitals/Pain Pain Assessment: Faces Faces Pain Scale: Hurts a little bit Pain Location: L hip Pain Descriptors / Indicators: Aching;Sore Pain Intervention(s): Limited activity within patient's tolerance;Monitored during session;Premedicated before session;Ice applied    Home Living Family/patient expects to be discharged to:: Skilled nursing facility Living Arrangements: Other (Comment)             Additional Comments: From ALF    Prior Function Level of Independence: Needs assistance  Gait / Transfers Assistance Needed: Limited ambulation with 4 wh RW or used WC   Comments: Pt and wife both in ALF - dtrs goal is for him to return once rehab at SNF level completed   PT Goals (current goals can now be found in the care plan section) Acute Rehab PT Goals Patient Stated Goal: Dtr states plan is short  term SNF level rehab and return to ALF as soon as able PT Goal Formulation: With patient Time For Goal Achievement: 02/28/15 Potential to Achieve Goals: Fair Progress towards PT goals: Progressing toward goals     Frequency  Min 3X/week    PT Plan Current plan remains appropriate    Co-evaluation             End of Session Equipment Utilized During Treatment: Gait belt Activity Tolerance: Patient tolerated treatment well;Patient limited by fatigue Patient left: in bed;with call bell/phone within reach     Time: 1500-1523 PT Time Calculation (min) (ACUTE ONLY): 23 min  Charges:  $Gait Training: 23-37 mins                    G Codes:      Neco Kling 2015/03/18, 3:26 PM

## 2015-02-21 NOTE — Clinical Social Work Note (Signed)
CSW met with pt at bedside to discuss discharge needs.  Pt asked CSW to call and speak with his daughter.  CSW called and spoke with pt's daughter Shauna Hugh to provide SNF bed options.  Pt's daughter will decide between [facility] and [facility] for pt's rehab needs.  Dede Query, LCSW [place] Worker - Weekend Coverage cell #: [phone]

## 2015-02-21 NOTE — Care Management Note (Signed)
Case Management Note  Patient Details  Name: Willie Mayer. MRN: [id number] Date of Birth: [birth year]  Subjective/Objective:                   Nondisplaced left intertrochanteric fracture  Action/Plan:  Discharge planning Expected Discharge Date:   (UNKNOWN)               Expected Discharge Plan:  Skilled Nursing Facility  In-House Referral:  Clinical Social Work  Discharge planning Services  CM Consult  Post Acute Care Choice:    Choice offered to:     DME Arranged:    DME Agency:     HH Arranged:    HH Agency:     Status of Service:  In progress, continue to follow  Medicare Important Message Given:    Date Medicare IM Given:    Medicare IM give by:    Date Additional Medicare IM Given:    Additional Medicare Important Message give by:     If discussed at Long Length of Stay Meetings, dates discussed:    Additional Comments: CM spoke with patient at the bedside. Patient states he plans to return home. Patient lives at home with his wife and has 24 hour care.CSW is seeing patient for placement. Awaiting PT recommendation.  Antony Haste, RN 02/21/2015, 10:34 AM

## 2015-02-21 NOTE — Evaluation (Signed)
Physical Therapy Evaluation Patient Details Name: Willie Mayer. MRN: [id number] DOB: 06/14/1933 Today's Date: 02/21/2015   History of Present Illness  Pt s/p fall with nondisplaced L hip fx s/p nailing.  Pt with hx of falls, CVA  s/p 2.5 yrs and dementia.  Clinical Impression  Pt s/p L hip fx presents with decreased L LE strength/ROM, generalized weakness, ongoing dementia and ambulatory balance deficits limiting functional mobility.  Pt would benefit from follow up rehab at SNF level to maximize IND and safety prior to return to ALF with spouse.   Follow Up Recommendations SNF    Equipment Recommendations  None recommended by PT    Recommendations for Other Services OT consult     Precautions / Restrictions Precautions Precautions: Fall Restrictions Weight Bearing Restrictions: No Other Position/Activity Restrictions: WBAT      Mobility  Bed Mobility Overal bed mobility: Needs Assistance;+2 for physical assistance;+ 2 for safety/equipment Bed Mobility: Supine to Sit     Supine to sit: +2 for physical assistance;+2 for safety/equipment;Min assist;Mod assist     General bed mobility comments: cues for sequence and use of R LE to self assist; physical assist to manage L LE and to bring trunk to upright  Transfers Overall transfer level: Needs assistance Equipment used: Rolling walker (2 wheeled) Transfers: Sit to/from Stand Sit to Stand: Min assist;Mod assist;+2 physical assistance;+2 safety/equipment;From elevated surface         General transfer comment: cues for LE management and use of UEs to self assist.  Physical assist to bring wt up and steady in standing  Ambulation/Gait Ambulation/Gait assistance: Min assist;Mod assist;+2 safety/equipment Ambulation Distance (Feet): 26 Feet Assistive device: Rolling walker (2 wheeled) Gait Pattern/deviations: Step-to pattern;Decreased step length - right;Decreased step length - left;Shuffle;Trunk flexed Gait velocity:  decr   General Gait Details: cues for sequence, posture, position from RW and increased UE WB  Stairs            Wheelchair Mobility    Modified Rankin (Stroke Patients Only)       Balance Overall balance assessment: Needs assistance Sitting-balance support: No upper extremity supported Sitting balance-Leahy Scale: Fair     Standing balance support: Bilateral upper extremity supported Standing balance-Leahy Scale: Poor                               Pertinent Vitals/Pain Pain Assessment: Faces Faces Pain Scale: Hurts little more Pain Location: L hip Pain Descriptors / Indicators: Sore Pain Intervention(s): Limited activity within patient's tolerance;Monitored during session;Ice applied    Home Living Family/patient expects to be discharged to:: Skilled nursing facility Living Arrangements: Other (Comment)               Additional Comments: From ALF    Prior Function Level of Independence: Needs assistance   Gait / Transfers Assistance Needed: Limited ambulation with 4 wh RW or used WC     Comments: Pt and wife both in ALF - dtrs goal is for him to return once rehab at SNF level completed     Hand Dominance   Dominant Hand: Right    Extremity/Trunk Assessment   Upper Extremity Assessment: Overall WFL for tasks assessed           Lower Extremity Assessment: Generalized weakness;LLE deficits/detail   LLE Deficits / Details: 2+/5 strength at hip with AAROM at hip to 80 flex and 20 abd  Cervical / Trunk Assessment: Normal  Communication   Communication: No difficulties  Cognition Arousal/Alertness: Awake/alert Behavior During Therapy: WFL for tasks assessed/performed Overall Cognitive Status: History of cognitive impairments - at baseline Area of Impairment: Safety/judgement;Problem solving;Memory     Memory: Decreased short-term memory   Safety/Judgement: Decreased awareness of safety;Decreased awareness of deficits    Problem Solving: Slow processing      General Comments      Exercises General Exercises - Lower Extremity Ankle Circles/Pumps: AROM;Both;15 reps;Supine Heel Slides: AAROM;Left;15 reps;Supine Hip ABduction/ADduction: AAROM;Left;15 reps;Supine      Assessment/Plan    PT Assessment Patient needs continued PT services  PT Diagnosis Difficulty walking   PT Problem List Decreased strength;Decreased range of motion;Decreased activity tolerance;Decreased mobility;Decreased knowledge of use of DME;Pain;Decreased cognition  PT Treatment Interventions DME instruction;Gait training;Stair training;Functional mobility training;Therapeutic activities;Therapeutic exercise;Patient/family education   PT Goals (Current goals can be found in the Care Plan section) Acute Rehab PT Goals Patient Stated Goal: Dtr states plan is short term SNF level rehab and return to ALF as soon as able PT Goal Formulation: With patient Time For Goal Achievement: 02/28/15 Potential to Achieve Goals: Fair    Frequency Min 3X/week   Barriers to discharge        Co-evaluation               End of Session Equipment Utilized During Treatment: Gait belt Activity Tolerance: Patient tolerated treatment well Patient left: in chair;with call bell/phone within reach;with family/visitor present Nurse Communication: Mobility status         Time: 9604-5409 PT Time Calculation (min) (ACUTE ONLY): 34 min   Charges:   PT Evaluation $Initial PT Evaluation Tier I: 1 Procedure PT Treatments $Gait Training: 8-22 mins   PT G Codes:        Corinda Ammon 2015/02/27, 2:40 PM

## 2015-02-22 ENCOUNTER — Encounter (HOSPITAL_COMMUNITY): Payer: Self-pay | Admitting: Orthopaedic Surgery

## 2015-02-22 DIAGNOSIS — N390 Urinary tract infection, site not specified: Secondary | ICD-10-CM

## 2015-02-22 MED ORDER — ONDANSETRON HCL 4 MG PO TABS: 4.0000 mg | ORAL_TABLET | Freq: Four times a day (QID) | ORAL | Status: DC | PRN

## 2015-02-22 MED ORDER — CIPROFLOXACIN HCL 500 MG PO TABS: 500.0000 mg | ORAL_TABLET | Freq: Two times a day (BID) | ORAL | Status: AC

## 2015-02-22 MED ORDER — BISACODYL 5 MG PO TBEC
5.0000 mg | DELAYED_RELEASE_TABLET | Freq: Every day | ORAL | Status: DC | PRN
Start: 2015-02-22 — End: 2015-06-25

## 2015-02-22 MED ORDER — SENNOSIDES-DOCUSATE SODIUM 8.6-50 MG PO TABS: 1.0000 | ORAL_TABLET | Freq: Every evening | ORAL | Status: DC | PRN

## 2015-02-22 MED ORDER — LACTINEX PO CHEW: 1.0000 | CHEWABLE_TABLET | Freq: Three times a day (TID) | ORAL | Status: DC

## 2015-02-22 MED ORDER — ENOXAPARIN SODIUM 30 MG/0.3ML ~~LOC~~ SOLN: 30.0000 mg | SUBCUTANEOUS | Status: DC

## 2015-02-22 NOTE — Discharge Summary (Signed)
Willie Mayer., is a 81 y.o. male  DOB Oct 19, 1932  MRN [id number].  Admission date:  02/19/2015  Admitting Physician  Starleen Arms, MD  Discharge Date:  02/22/2015   Primary MD  Gaye Alken, MD  Recommendations for primary care physician for things to follow:  - Check CBC, BMP in 3 days - Patient to continue with Lovenox for DVT prophylaxis for next 21 days - We have to follow with Dr. Ophelia Charter as outpatient    Admission Diagnosis  Fall [W19.XXXA] Fall from standing, initial encounter [W19.XXXA] Fracture, intertrochanteric, left femur, closed, initial encounter [S72.142A]   Discharge Diagnosis  Fall [W19.XXXA] Fall from standing, initial encounter [W19.XXXA] Fracture, intertrochanteric, left femur, closed, initial encounter [S72.142A]    Active Problems:   Dementia with behavioral disturbance   Hypertension   History of CVA (cerebrovascular accident)   Depression   Glaucoma   Alzheimer's disease   Hip fracture   RBBB (right bundle branch block with left anterior fascicular block)   Preoperative clearance      Past Medical History  Diagnosis Date  . Dementia   . Hypertension   . History of CVA (cerebrovascular accident)   . History of DVT (deep vein thrombosis)   . History of pulmonary embolism   . Depression   . Glaucoma   . Urge incontinence   . BPH (benign prostatic hyperplasia)     s/p surgery that "didnt work"  . Stroke   . Shortness of breath dyspnea     Past Surgical History  Procedure Laterality Date  . Replacement total knee bilateral    . Bph surgery    . Joint replacement         History of present illness and  Hospital Course:     Kindly see H&P for history of present illness and admission details, please review complete Labs, Consult reports and Test reports for all details in brief  HPI  from the history and physical done on the day of  admission Willie Mayer is a 81 y.o. male, past medical history significant for history CVA, alzheimers dementia , history DVT/PE ,hyperlipidemia,  Lives at home with his wife with 24 hours care, ambulates with no assistance for short distance, walker for longer distance in-home, presents with fall this a.m. while going to the bathroom, and left hip pain, patient had ground-level fall while standing with his walker this morning, he was laying on the floor for 3 hours before EMS were called by home aid, hip x-ray significant for nondisplaced intertrochanteric femur fracture, patient denies any chest pain, shortness of breath, fever, chills, workup was significant for leukocytosis at 15,000, he denies any dysuria or polyuria cough or fever, will EKG showing new RBBB, and LAFB.   Hospital Course   Left hip fracture - Patient with fall, nondisplaced intertrochanteric hip fracture, status post surgical repair by Dr. Ophelia Charter on 02/20/15 -  Lovenox for DVT prophylaxis dose for next 21 days. - cleared  by cardiology giving his new RBBB and  LAFB in EKG - for SNF placment  History of CVA - We'll resume back on Plavix, discussed with orthopedic  UTI - Treated with Rocephin during hospital stay, we'll discharge on another 4 days of oral ciprofloxacin.  Glaucoma - Continue with home medication  Alzheimer's disease - Continue with Namenda  Depression - Continue with Celexa  Hyperlipidemia - Continue with statin     Discharge Condition: stable   Follow UP  Follow-up Information    Follow up with YATES,MARK C, MD In 2 weeks.   Specialty:  Orthopedic Surgery   Why:  need return office visit 2 weeks posto   Contact information:   [address] [place] [address] [phone]       Call Gaye Alken, MD.   Specialty:  Family Medicine   Why:  after discharge from SNF   Contact information:   [address] [place] [address] [phone]          Discharge Instructions  and  Discharge Medications     Discharge Instructions    Diet - low sodium heart healthy    Complete by:  As directed      Discharge instructions    Complete by:  As directed   Follow with Primary MD Gaye Alken, MD in 7 days   Get CBC, CMP, 2 view Chest X ray checked  by Primary MD next visit.    Activity: As per PT/OT recommendation   Disposition Home **   Diet: Heart Healthy ** , with feeding assistance and aspiration precautions.  For Heart failure patients - Check your Weight same time everyday, if you gain over 2 pounds, or you develop in leg swelling, experience more shortness of breath or chest pain, call your Primary MD immediately. Follow Cardiac Low Salt Diet and 1.5 lit/day fluid restriction.   On your next visit with your primary care physician please Get Medicines reviewed and adjusted.   Please request your Prim.MD to go over all Hospital Tests and Procedure/Radiological results at the follow up, please get all Hospital records sent to your Prim MD by signing hospital release before you go home.   If you experience worsening of your admission symptoms, develop shortness of breath, life threatening emergency, suicidal or homicidal thoughts you must seek medical attention immediately by calling 911 or calling your MD immediately  if symptoms less severe.  You Must read complete instructions/literature along with all the possible adverse reactions/side effects for all the Medicines you take and that have been prescribed to you. Take any new Medicines after you have completely understood and accpet all the possible adverse reactions/side effects.   Do not drive, operating heavy machinery, perform activities at heights, swimming or participation in water activities or provide baby sitting services if your were admitted for syncope or siezures until you have seen by Primary MD or a Neurologist and advised to do so again.  Do not  drive when taking Pain medications.    Do not take more than prescribed Pain, Sleep and Anxiety Medications  Special Instructions: If you have smoked or chewed Tobacco  in the last 2 yrs please stop smoking, stop any regular Alcohol  and or any Recreational drug use.  Wear Seat belts while driving.   Please note  You were cared for by a hospitalist during your hospital stay. If you have any questions about your discharge medications or the care you received while you were in the hospital after you are discharged, you  can call the unit and asked to speak with the hospitalist on call if the hospitalist that took care of you is not available. Once you are discharged, your primary care physician will handle any further medical issues. Please note that NO REFILLS for any discharge medications will be authorized once you are discharged, as it is imperative that you return to your primary care physician (or establish a relationship with a primary care physician if you do not have one) for your aftercare needs so that they can reassess your need for medications and monitor your lab values.     Increase activity slowly    Complete by:  As directed      Weight bearing as tolerated    Complete by:  As directed   Laterality:  right  Extremity:  Lower            Medication List    TAKE these medications        bisacodyl 5 MG EC tablet  Commonly known as:  DULCOLAX  Take 1 tablet (5 mg total) by mouth daily as needed for moderate constipation.     brinzolamide 1 % ophthalmic suspension  Commonly known as:  AZOPT  Place 1 drop into the right eye 2 (two) times daily.     ciprofloxacin 500 MG tablet  Commonly known as:  CIPRO  Take 1 tablet (500 mg total) by mouth 2 (two) times daily. take for 4 days then stop     citalopram 40 MG tablet  Commonly known as:  CELEXA  Take 40 mg by mouth daily.     clopidogrel 75 MG tablet  Commonly known as:  PLAVIX  Take 1 tablet (75 mg total) by mouth  daily.     COMBIGAN 0.2-0.5 % ophthalmic solution  Generic drug:  brimonidine-timolol  Place 1 drop into both eyes every 12 (twelve) hours.     enoxaparin 30 MG/0.3ML injection  Commonly known as:  LOVENOX  Inject 0.3 mLs (30 mg total) into the skin daily. Please take for DVT prophylaxis for 3 weeks then stop     Ergocalciferol 400 UNITS Tabs  Take 400 Units by mouth daily.     HYDROcodone-acetaminophen 5-325 MG per tablet  Commonly known as:  NORCO  Take 1 tablet by mouth every 6 (six) hours as needed for moderate pain.     lactobacillus acidophilus & bulgar chewable tablet  Chew 1 tablet by mouth 3 (three) times daily with meals. Take for 10 days and then stop     memantine 10 MG tablet  Commonly known as:  NAMENDA  TAKE ONE TABLET BY MOUTH TWICE DAILY     multivitamin with minerals Tabs tablet  Take 1 tablet by mouth daily.     MYRBETRIQ 50 MG Tb24 tablet  Generic drug:  mirabegron ER  Take 50 mg by mouth daily.     ondansetron 4 MG tablet  Commonly known as:  ZOFRAN  Take 1 tablet (4 mg total) by mouth every 6 (six) hours as needed for nausea.     pravastatin 20 MG tablet  Commonly known as:  PRAVACHOL  TAKE ONE TABLET BY MOUTH ONCE DAILY     senna-docusate 8.6-50 MG per tablet  Commonly known as:  Senokot-S  Take 1 tablet by mouth at bedtime as needed for mild constipation.     tamsulosin 0.4 MG Caps capsule  Commonly known as:  FLOMAX  Take 0.4 mg by mouth every morning.  Diet and Activity recommendation: See Discharge Instructions above   Consults obtained -  Orthopedics cardiology   Major procedures and Radiology Reports - PLEASE review detailed and final reports for all details, in brief - Surgical repair of left hip fracture by Dr. Ophelia Charter 8/6     Dg Chest Port 1 View  02/19/2015   CLINICAL DATA:  Pain following fall. Intermittent cough and shortness of breath  EXAM: PORTABLE CHEST - 1 VIEW  COMPARISON:  January 04, 2015  FINDINGS: There  is no edema or consolidation. The heart size and pulmonary vascularity are normal. No pneumothorax. No adenopathy. No bone lesions.  IMPRESSION: No edema or consolidation.   Electronically Signed   By: Bretta Bang III M.D.   On: 02/19/2015 10:02   Dg Hip Operative Unilat With Pelvis Left  02/20/2015   CLINICAL DATA:  ORIF of a left proximal femur fracture.  EXAM: OPERATIVE LEFT HIP (WITH PELVIS IF PERFORMED)  VIEWS  TECHNIQUE: Fluoroscopic spot image(s) were submitted for interpretation post-operatively.  COMPARISON:  02/19/2015  FINDINGS: Femoral neck fracture has been stable lies with a compression screw and intra medullary rod. The orthopedic hardware well-seated. Fracture fragments are well aligned. There is no new fracture or evidence of an operative complication.  IMPRESSION: Well-aligned left proximal femur fracture fragments following ORIF.   Electronically Signed   By: Amie Portland M.D.   On: 02/20/2015 13:28   Dg Hip Unilat With Pelvis 2-3 Views Left  02/19/2015   CLINICAL DATA:  Pain following fall earlier today  EXAM: DG HIP (WITH OR WITHOUT PELVIS) 2-3V LEFT  COMPARISON:  None.  FINDINGS: Frontal pelvis as well as frontal and lateral left hip images were obtained. On the frontal view, there is an obliquely oriented lucency in the femoral neck region on the left with apparent cortical disruption along the lateral mid femoral neck region, concerning for incomplete fracture. A fracture is not appreciable on the lateral view, however. There is no other evidence of potential fracture. No dislocation. There is slight narrowing of both hip joints. There is extensive osteoarthritic change in the visualized lumbar spine.  IMPRESSION: Concern for fracture, obliquely oriented, in the femoral neck region, not confirmed on the lateral view. Given this appearance, correlation with CT or MR of the left hip region is advised. No other findings concerning for potential fracture. No dislocation. Mild symmetric  narrowing of both hip joints. Degenerative type change in visualized lumbar spine.   Electronically Signed   By: Bretta Bang III M.D.   On: 02/19/2015 09:36   Dg Femur Min 2 Views Left  02/19/2015   CLINICAL DATA:  Fall at home today. Left hip and femur pain. Initial encounter.  EXAM: LEFT FEMUR 2 VIEWS  COMPARISON:  None.  FINDINGS: And intratrochanteric fracture is nondisplaced. A left total knee arthroplasty is noted. The knee is located. The hip is located. No additional fractures are present.  IMPRESSION: Nondisplaced intertrochanteric fracture of the left hip.   Electronically Signed   By: Marin Roberts M.D.   On: 02/19/2015 09:38    Micro Results     No results found for this or any previous visit (from the past 240 hour(s)).     Today   Subjective:   Willie Mayer today has no headache,no chest abdominal pain,no new weakness tingling or numbneness.  Objective:   Blood pressure 122/57, pulse 60, temperature 98.7 F (37.1 C), temperature source Oral, resp. rate 20, height 6\' 2"  (1.88 m), weight 110.678  kg (244 lb), SpO2 97 %.   Intake/Output Summary (Last 24 hours) at 02/22/15 1154 Last data filed at 02/22/15 1000  Gross per 24 hour  Intake    915 ml  Output    150 ml  Net    765 ml    Exam Awake Alert,  Willie Mayer.AT,PERRAL Supple Neck,No JVD,  Symmetrical Chest wall movement, Good air movement bilaterally, CTAB RRR,No Gallops,Rubs or new Murmurs,  +ve B.Sounds, Abd Soft, No tenderness, No organomegaly appriciated, No Cyanosis, Clubbing or edema, No new Rash or bruise   Data Review   CBC w Diff: Lab Results  Component Value Date   WBC 13.5* 02/21/2015   HGB 11.9* 02/21/2015   HCT 35.2* 02/21/2015   PLT 145* 02/21/2015   LYMPHOPCT 8* 02/19/2015   MONOPCT 4 02/19/2015   EOSPCT 0 02/19/2015   BASOPCT 0 02/19/2015    CMP: Lab Results  Component Value Date   NA 135 02/21/2015   K 4.4 02/21/2015   CL 107 02/21/2015   CO2 25 02/21/2015   BUN 19  02/21/2015   CREATININE 1.01 02/21/2015   CREATININE 1.38* 09/09/2013   PROT 6.9 09/09/2013   ALBUMIN 4.2 09/09/2013   BILITOT 1.4* 09/09/2013   ALKPHOS 83 09/09/2013   AST 20 09/09/2013   ALT 13 09/09/2013  .   Total Time in preparing paper work, data evaluation and todays exam - 35 minutes  Willie Mayer M.D on 02/22/2015 at 11:54 AM  [facility]   Office  [phone]

## 2015-02-22 NOTE — Progress Notes (Signed)
OT Cancellation Note  Patient Details Name: Willie Mayer. MRN: [id number] DOB: 02-Dec-1932   Cancelled Treatment:    Reason Eval/Treat Not Completed: Other (comment) Will defer OT eval to SNF.   Lennox Laity  [phone] 02/22/2015, 12:24 PM

## 2015-02-22 NOTE — Clinical Social Work Placement (Signed)
   CLINICAL SOCIAL WORK PLACEMENT  NOTE  Date:  02/22/2015  Patient Details  Name: Willie Mayer. MRN: [id number] Date of Birth: 1932-08-08  Clinical Social Work is seeking post-discharge placement for this patient at the Skilled  Nursing Facility level of care (*CSW will initial, date and re-position this form in  chart as items are completed):  Yes   Patient/family provided with [facility] Clinical Social Work Department's list of facilities offering this level of care within the geographic area requested by the patient (or if unable, by the patient's family).  Yes   Patient/family informed of their freedom to choose among providers that offer the needed level of care, that participate in Medicare, Medicaid or managed care program needed by the patient, have an available bed and are willing to accept the patient.  Yes   Patient/family informed of [facility]'s ownership interest in [facility] and [facility], as well as of the fact that they are under no obligation to receive care at these facilities.  PASRR submitted to EDS on 02/19/15     PASRR number received on 02/19/15     Existing PASRR number confirmed on       FL2 transmitted to all facilities in geographic area requested by pt/family on 02/19/15     FL2 transmitted to all facilities within larger geographic area on       Patient informed that his/her managed care company has contracts with or will negotiate with certain facilities, including the following:        Yes   Patient/family informed of bed offers received.  Patient chooses bed at [facility]     Physician recommends and patient chooses bed at      Patient to be transferred to [facility] on 02/22/15.  Patient to be transferred to facility by [facility]     Patient family notified on 02/22/15 of transfer.  Name of family member notified:  DAUGHTER     PHYSICIAN       Additional Comment: Pt / daughter are in agreement with d/c to [facility]  today. [facility] transport is required. Pt / family are aware out of pocket costs may be associated with [facility] transport. [employer] medicare has provided authorization for SNF placement. [facility] has reviewed d/c summary, avs, scripts. Scripts have been included in d/c packet.   _______________________________________________ Royetta Asal, LCSW  [phone] 02/22/2015, 1:30 PM

## 2015-02-22 NOTE — Progress Notes (Signed)
Utilization review completed.  

## 2015-02-22 NOTE — Progress Notes (Addendum)
Subjective: Doing well.  Pain controlled.     Objective: Vital signs in last 24 hours: Temp:  [98.1 F (36.7 C)-98.7 F (37.1 C)] 98.7 F (37.1 C) (08/08 0553) Pulse Rate:  [60-70] 60 (08/08 0553) Resp:  [14-20] 20 (08/08 0553) BP: (101-122)/(57-68) 122/57 mmHg (08/08 0553) SpO2:  [96 %-98 %] 97 % (08/08 0553) Weight:  [110.678 kg (244 lb)] 110.678 kg (244 lb) (08/08 0743)  Intake/Output from previous day: 08/07 0701 - 08/08 0700 In: 1520 [P.O.:720; I.V.:750; IV Piggyback:50] Out: 100 [Urine:100] Intake/Output this shift: Total I/O In: -  Out: 100 [Urine:100]   Recent Labs  02/20/15 1509 02/21/15 0512  HGB 13.4 11.9*    Recent Labs  02/20/15 1509 02/21/15 0512  WBC 14.9* 13.5*  RBC 4.42 3.92*  HCT 40.1 35.2*  PLT 146* 145*    Recent Labs  02/20/15 1509 02/21/15 0512  NA  --  135  K  --  4.4  CL  --  107  CO2  --  25  BUN  --  19  CREATININE 1.03 1.01  GLUCOSE  --  144*  CALCIUM  --  8.4*    Recent Labs  02/21/15 0512  INR 1.26    Neurovascular intact Dorsiflexion/Plantar flexion intact No cellulitis present Compartment soft Staples intact.  No drainage.  Assessment/Plan: Doing well for ortho standpoint.  Transfer to SNF when medically stable.  Script for norco on chart.     Kache Mcclurg M 02/22/2015, 10:05 AM

## 2015-02-22 NOTE — Discharge Instructions (Signed)
Daily dressing changes with 4x4 gauze and tape.  Do not apply any creams or ointments to incisions.  Ok to shower but no tub soaking.  weightbear as tolerated with walker.

## 2015-02-23 ENCOUNTER — Encounter: Payer: Self-pay | Admitting: Adult Health

## 2015-02-23 ENCOUNTER — Non-Acute Institutional Stay (SKILLED_NURSING_FACILITY): Payer: Commercial Managed Care - HMO | Admitting: Adult Health

## 2015-02-23 DIAGNOSIS — K5901 Slow transit constipation: Secondary | ICD-10-CM

## 2015-02-23 DIAGNOSIS — N400 Enlarged prostate without lower urinary tract symptoms: Secondary | ICD-10-CM | POA: Diagnosis not present

## 2015-02-23 DIAGNOSIS — F329 Major depressive disorder, single episode, unspecified: Secondary | ICD-10-CM

## 2015-02-23 DIAGNOSIS — F0280 Dementia in other diseases classified elsewhere without behavioral disturbance: Secondary | ICD-10-CM

## 2015-02-23 DIAGNOSIS — H409 Unspecified glaucoma: Secondary | ICD-10-CM | POA: Diagnosis not present

## 2015-02-23 DIAGNOSIS — G309 Alzheimer's disease, unspecified: Secondary | ICD-10-CM | POA: Diagnosis not present

## 2015-02-23 DIAGNOSIS — F32A Depression, unspecified: Secondary | ICD-10-CM

## 2015-02-23 DIAGNOSIS — D72829 Elevated white blood cell count, unspecified: Secondary | ICD-10-CM

## 2015-02-23 DIAGNOSIS — E785 Hyperlipidemia, unspecified: Secondary | ICD-10-CM | POA: Diagnosis not present

## 2015-02-23 DIAGNOSIS — S72002S Fracture of unspecified part of neck of left femur, sequela: Secondary | ICD-10-CM

## 2015-02-24 ENCOUNTER — Non-Acute Institutional Stay (SKILLED_NURSING_FACILITY): Payer: Commercial Managed Care - HMO | Admitting: Internal Medicine

## 2015-02-24 ENCOUNTER — Encounter: Payer: Self-pay | Admitting: Internal Medicine

## 2015-02-24 DIAGNOSIS — K5901 Slow transit constipation: Secondary | ICD-10-CM

## 2015-02-24 DIAGNOSIS — F0280 Dementia in other diseases classified elsewhere without behavioral disturbance: Secondary | ICD-10-CM | POA: Diagnosis not present

## 2015-02-24 DIAGNOSIS — D62 Acute posthemorrhagic anemia: Secondary | ICD-10-CM

## 2015-02-24 DIAGNOSIS — F0393 Unspecified dementia, unspecified severity, with mood disturbance: Secondary | ICD-10-CM

## 2015-02-24 DIAGNOSIS — Z8673 Personal history of transient ischemic attack (TIA), and cerebral infarction without residual deficits: Secondary | ICD-10-CM

## 2015-02-24 DIAGNOSIS — G309 Alzheimer's disease, unspecified: Secondary | ICD-10-CM

## 2015-02-24 DIAGNOSIS — D72829 Elevated white blood cell count, unspecified: Secondary | ICD-10-CM

## 2015-02-24 DIAGNOSIS — N400 Enlarged prostate without lower urinary tract symptoms: Secondary | ICD-10-CM | POA: Diagnosis not present

## 2015-02-24 DIAGNOSIS — F329 Major depressive disorder, single episode, unspecified: Secondary | ICD-10-CM | POA: Diagnosis not present

## 2015-02-24 DIAGNOSIS — N3000 Acute cystitis without hematuria: Secondary | ICD-10-CM

## 2015-02-24 DIAGNOSIS — S72002S Fracture of unspecified part of neck of left femur, sequela: Secondary | ICD-10-CM

## 2015-02-24 DIAGNOSIS — R32 Unspecified urinary incontinence: Secondary | ICD-10-CM

## 2015-02-24 NOTE — Progress Notes (Signed)
Patient ID: Willie Pellot., male   DOB: [birth year], 81 y.o.   MRN: [id number]    DATE:  02/23/15 MRN:  [id number]  BIRTHDAY: Dec 17, 1932  Facility:  Nursing Home Location:  [facility]  Nursing Home Room Number: 104-P  LEVEL OF CARE:  SNF (31)  Contact Information    Name Relation Home Work Mobile   Coltrane,Diane Daughter [phone]         Chief Complaint  Patient presents with  . Hospitalization Follow-up    left hip fracture S/P surgical repair, glaucoma, alzheimer's disease, hyperlipidemia, constipation, depression, BPH and leukocytosis    HISTORY OF PRESENT ILLNESS:  This is an 81 year old male who has been admitted to [facility] on 02/22/15 from [facility]. He had a fall and sustained a left femur fracture for which he had surgical repair. He has PMH of CVA, Alzheimer's disease, Hx of DVT/PE and hyperlipidemia.  He has been admitted for a short-term rehabilitation.  PAST MEDICAL HISTORY:  Past Medical History  Diagnosis Date  . Dementia   . Hypertension   . History of CVA (cerebrovascular accident)   . History of DVT (deep vein thrombosis)   . History of pulmonary embolism   . Depression   . Glaucoma   . Urge incontinence   . BPH (benign prostatic hyperplasia)     s/p surgery that "didnt work"  . Stroke   . Shortness of breath dyspnea      CURRENT MEDICATIONS: Reviewed  Patient's Medications  New Prescriptions   No medications on file  Previous Medications   BISACODYL (DULCOLAX) 5 MG EC TABLET    Take 1 tablet (5 mg total) by mouth daily as needed for moderate constipation.   BRIMONIDINE-TIMOLOL (COMBIGAN) 0.2-0.5 % OPHTHALMIC SOLUTION    Place 1 drop into both eyes every 12 (twelve) hours.   BRINZOLAMIDE (AZOPT) 1 % OPHTHALMIC SUSPENSION    Place 1 drop into the right eye 2 (two) times daily.   CIPROFLOXACIN (CIPRO) 500 MG TABLET    Take 1 tablet (500 mg total) by mouth 2 (two) times daily. take for 4 days then stop   CITALOPRAM (CELEXA) 40 MG TABLET    Take 40 mg by mouth daily.   CLOPIDOGREL (PLAVIX) 75 MG TABLET    Take 1 tablet (75 mg total) by mouth daily.   ENOXAPARIN (LOVENOX) 30 MG/0.3ML INJECTION    Inject 0.3 mLs (30 mg total) into the skin daily. Please take for DVT prophylaxis for 3 weeks then stop   ERGOCALCIFEROL 400 UNITS TABS    Take 400 Units by mouth daily.   HYDROCODONE-ACETAMINOPHEN (NORCO) 5-325 MG PER TABLET    Take 1 tablet by mouth every 6 (six) hours as needed for moderate pain.   LACTOBACILLUS ACIDOPHILUS & BULGAR (LACTINEX) CHEWABLE TABLET    Chew 1 tablet by mouth 3 (three) times daily with meals. Take for 10 days and then stop   MEMANTINE (NAMENDA) 10 MG TABLET    TAKE ONE TABLET BY MOUTH TWICE DAILY   MIRABEGRON ER (MYRBETRIQ) 50 MG TB24 TABLET    Take 50 mg by mouth daily.   MULTIPLE VITAMIN (MULTIVITAMIN WITH MINERALS) TABS TABLET    Take 1 tablet by mouth daily.   ONDANSETRON (ZOFRAN) 4 MG TABLET    Take 1 tablet (4 mg total) by mouth every 6 (six) hours as needed for nausea.   PRAVASTATIN (PRAVACHOL) 20 MG TABLET    TAKE ONE TABLET BY MOUTH ONCE DAILY  SENNA-DOCUSATE (SENOKOT-S) 8.6-50 MG PER TABLET    Take 1 tablet by mouth at bedtime as needed for mild constipation.   TAMSULOSIN (FLOMAX) 0.4 MG CAPS CAPSULE    Take 0.4 mg by mouth every morning.  Modified Medications   No medications on file  Discontinued Medications   No medications on file     No Known Allergies   REVIEW OF SYSTEMS:  GENERAL: no change in appetite, no fatigue, no weight changes, no fever, chills or weakness EYES: Denies change in vision, dry eyes, eye pain, itching or discharge EARS: Denies change in hearing, ringing in ears, or earache NOSE: Denies nasal congestion or epistaxis MOUTH and THROAT: Denies oral discomfort, gingival pain or bleeding, pain from teeth or hoarseness   RESPIRATORY: no cough, SOB, DOE, wheezing, hemoptysis CARDIAC: no chest pain, edema or palpitations GI: no abdominal  pain, diarrhea, constipation, heart burn, nausea or vomiting GU: Denies dysuria, frequency, hematuria, incontinence, or discharge PSYCHIATRIC: Denies feeling of depression or anxiety. No report of hallucinations, insomnia, paranoia, or agitation   PHYSICAL EXAMINATION  GENERAL APPEARANCE: Well nourished. In no acute distress. Normal body habitus SKIN:  Left hip surgical incision has dressing with dry blood,  no erythema HEAD: Normal in size and contour. No evidence of trauma EYES: Lids open and close normally. No blepharitis, entropion or ectropion. PERRL. Conjunctivae are clear and sclerae are white. Lenses are without opacity EARS: Pinnae are normal. Patient hears normal voice tunes of the examiner MOUTH and THROAT: Lips are without lesions. Oral mucosa is moist and without lesions. Tongue is normal in shape, size, and color and without lesions NECK: supple, trachea midline, no neck masses, no thyroid tenderness, no thyromegaly LYMPHATICS: no LAN in the neck, no supraclavicular LAN RESPIRATORY: breathing is even & unlabored, BS CTAB CARDIAC: RRR, no murmur,no extra heart sounds, no edema GI: abdomen soft, normal BS, no masses, no tenderness, no hepatomegaly, no splenomegaly EXTREMITIES:  Able to move X 4 extremities PSYCHIATRIC: Alert and oriented X 3. Affect and behavior are appropriate  LABS/RADIOLOGY: Labs reviewed: Basic Metabolic Panel:  Recent Labs  40/98/11 1736 02/19/15 0946 02/20/15 1509 02/21/15 0512  NA 137 137  --  135  K 4.5 4.5  --  4.4  CL 104 103  --  107  CO2 25 27  --  25  GLUCOSE 136* 147*  --  144*  BUN 22* 18  --  19  CREATININE 1.52* 1.21 1.03 1.01  CALCIUM 9.3 9.2  --  8.4*   CBC:  Recent Labs  02/19/15 0946 02/20/15 1509 02/21/15 0512  WBC 15.3* 14.9* 13.5*  NEUTROABS 13.4*  --   --   HGB 13.3 13.4 11.9*  HCT 39.4 40.1 35.2*  MCV 89.5 90.7 89.8  PLT 159 146* 145*    CBG:  Recent Labs  02/20/15 2038  GLUCAP 161*    Dg Chest Port  1 View  02/19/2015   CLINICAL DATA:  Pain following fall. Intermittent cough and shortness of breath  EXAM: PORTABLE CHEST - 1 VIEW  COMPARISON:  January 04, 2015  FINDINGS: There is no edema or consolidation. The heart size and pulmonary vascularity are normal. No pneumothorax. No adenopathy. No bone lesions.  IMPRESSION: No edema or consolidation.   Electronically Signed   By: Bretta Bang III M.D.   On: 02/19/2015 10:02   Dg Hip Operative Unilat With Pelvis Left  02/20/2015   CLINICAL DATA:  ORIF of a left proximal femur fracture.  EXAM: OPERATIVE  LEFT HIP (WITH PELVIS IF PERFORMED)  VIEWS  TECHNIQUE: Fluoroscopic spot image(s) were submitted for interpretation post-operatively.  COMPARISON:  02/19/2015  FINDINGS: Femoral neck fracture has been stable lies with a compression screw and intra medullary rod. The orthopedic hardware well-seated. Fracture fragments are well aligned. There is no new fracture or evidence of an operative complication.  IMPRESSION: Well-aligned left proximal femur fracture fragments following ORIF.   Electronically Signed   By: Amie Portland M.D.   On: 02/20/2015 13:28   Dg Hip Unilat With Pelvis 2-3 Views Left  02/19/2015   CLINICAL DATA:  Pain following fall earlier today  EXAM: DG HIP (WITH OR WITHOUT PELVIS) 2-3V LEFT  COMPARISON:  None.  FINDINGS: Frontal pelvis as well as frontal and lateral left hip images were obtained. On the frontal view, there is an obliquely oriented lucency in the femoral neck region on the left with apparent cortical disruption along the lateral mid femoral neck region, concerning for incomplete fracture. A fracture is not appreciable on the lateral view, however. There is no other evidence of potential fracture. No dislocation. There is slight narrowing of both hip joints. There is extensive osteoarthritic change in the visualized lumbar spine.  IMPRESSION: Concern for fracture, obliquely oriented, in the femoral neck region, not confirmed on the  lateral view. Given this appearance, correlation with CT or MR of the left hip region is advised. No other findings concerning for potential fracture. No dislocation. Mild symmetric narrowing of both hip joints. Degenerative type change in visualized lumbar spine.   Electronically Signed   By: Bretta Bang III M.D.   On: 02/19/2015 09:36   Dg Femur Min 2 Views Left  02/19/2015   CLINICAL DATA:  Fall at home today. Left hip and femur pain. Initial encounter.  EXAM: LEFT FEMUR 2 VIEWS  COMPARISON:  None.  FINDINGS: And intratrochanteric fracture is nondisplaced. A left total knee arthroplasty is noted. The knee is located. The hip is located. No additional fractures are present.  IMPRESSION: Nondisplaced intertrochanteric fracture of the left hip.   Electronically Signed   By: Marin Roberts M.D.   On: 02/19/2015 09:38    ASSESSMENT/PLAN:  Left hip fracture S/P surgical repair - for rehabilitation; RLE WBAT; continue Lovenox 30 mg SQ Q D X 3 weeks for DVT prophylaxis; Norco 5/325 mg 1 tab PO Q 6 hours PRN for pain; follow-up with Dr. Ophelia Charter, orthopedic surgeon, in 2 weeks; check BMP  Hx of CVA - continue Plavix 75 mg 1 tab PO Q D  Glaucoma - continue Brinzolamide  and Combigan eye gtts  Alzheimer's disease - continue Namenda 10 mg 1 tab PO BID  Hyperlipidemia - continue Pravastatin 20 mg 1 tab PO Q D  Constipation - continue Dulcolax and Senna-S PRN  Depression - mood is stable; continue Celexa 40 mg 1 tab PO Q D  BPH - continue Flomax 0.4 mg 1 capsule PO Q AM  Leukocytosis - wbc 13.5; continue Cipro 500 mg 1 tab PO BID X 4 days; check CBC     Goals of care:  Short-term rehabilitation    [facility], NP [facility] [phone]

## 2015-02-24 NOTE — Progress Notes (Signed)
Patient ID: Willie Knabb., male   DOB: October 16, 1932, 81 y.o.   MRN: [id number]      [facility]  PCP: Gaye Alken, MD  Code Status: DNR  No Known Allergies  Chief Complaint  Patient presents with  . New Admit To SNF    New Admit from Hospital     HPI:  81 y.o. patient is here for short term rehabilitation post hospital admission from 02/19/15-02/22/15 post fall with left femur intertrochanteric fracture. He underwent IM nailing. He was treated for UTI. He is WBAT and here for rehabilitation. He has PMH of dementia, HTN, CVA, depression among others. He is seen in his room. He denies any concerns. He is alert and oriented to person and place. No new concern from staff.   Review of Systems:  Constitutional: Negative for fever, chills, diaphoresis.  HENT: Negative for headache, congestion, nasal discharge Eyes: Negative for eye pain, blurred vision, double vision and discharge.  Respiratory: Negative for cough, shortness of breath and wheezing.   Cardiovascular: Negative for chest pain, palpitations, leg swelling.  Gastrointestinal: Negative for heartburn, nausea, vomiting, abdominal pain. Had bowel movement yesterday Genitourinary: Negative for dysuria  Musculoskeletal: Negative for back pain, falls Skin: Negative for itching, rash.  Neurological: Negative for dizziness, tingling, focal weakness Psychiatric/Behavioral: Negative for depression.positive for memory loss.    Past Medical History  Diagnosis Date  . Dementia   . Hypertension   . History of CVA (cerebrovascular accident)   . History of DVT (deep vein thrombosis)   . History of pulmonary embolism   . Depression   . Glaucoma   . Urge incontinence   . BPH (benign prostatic hyperplasia)     s/p surgery that "didnt work"  . Stroke   . Shortness of breath dyspnea    Past Surgical History  Procedure Laterality Date  . Replacement total knee bilateral    . Bph surgery    . Joint replacement     . Intramedullary (im) nail intertrochanteric Left 02/20/2015    Procedure: LEFT  INTERTROCHANTRIC HIP FRACTURE;  Surgeon: Eldred Manges, MD;  Location: [facility];  Service: Orthopedics;  Laterality: Left;   Social History:   reports that he quit smoking about 49 years ago. He has never used smokeless tobacco. He reports that he drinks alcohol. He reports that he does not use illicit drugs.  Family History  Problem Relation Age of Onset  . Alcoholism Father     died in 55s related to complciations  . Heart failure Mother   . Arrhythmia Mother     Medications:   Medication List       This list is accurate as of: 02/24/15  2:01 PM.  Always use your most recent med list.               bisacodyl 5 MG EC tablet  Commonly known as:  DULCOLAX  Take 1 tablet (5 mg total) by mouth daily as needed for moderate constipation.     brinzolamide 1 % ophthalmic suspension  Commonly known as:  AZOPT  Place 1 drop into the right eye 2 (two) times daily.     ciprofloxacin 500 MG tablet  Commonly known as:  CIPRO  Take 1 tablet (500 mg total) by mouth 2 (two) times daily. take for 4 days then stop     citalopram 40 MG tablet  Commonly known as:  CELEXA  Take 40 mg by mouth daily.  clopidogrel 75 MG tablet  Commonly known as:  PLAVIX  Take 1 tablet (75 mg total) by mouth daily.     COMBIGAN 0.2-0.5 % ophthalmic solution  Generic drug:  brimonidine-timolol  Place 1 drop into both eyes every 12 (twelve) hours.     enoxaparin 30 MG/0.3ML injection  Commonly known as:  LOVENOX  Inject 0.3 mLs (30 mg total) into the skin daily. Please take for DVT prophylaxis for 3 weeks then stop     HYDROcodone-acetaminophen 5-325 MG per tablet  Commonly known as:  NORCO  Take 1 tablet by mouth every 6 (six) hours as needed for moderate pain.     lactobacillus acidophilus & bulgar chewable tablet  Chew 1 tablet by mouth 3 (three) times daily with meals. Take for 10 days and then stop     memantine  10 MG tablet  Commonly known as:  NAMENDA  TAKE ONE TABLET BY MOUTH TWICE DAILY     multivitamin with minerals Tabs tablet  Take 1 tablet by mouth daily.     MYRBETRIQ 50 MG Tb24 tablet  Generic drug:  mirabegron ER  Take one tablet by mouth once daily for bladder     ondansetron 4 MG tablet  Commonly known as:  ZOFRAN  Take 1 tablet (4 mg total) by mouth every 6 (six) hours as needed for nausea.     pravastatin 20 MG tablet  Commonly known as:  PRAVACHOL  TAKE ONE TABLET BY MOUTH ONCE DAILY     senna-docusate 8.6-50 MG per tablet  Commonly known as:  Senokot-S  Take 1 tablet by mouth at bedtime as needed for mild constipation.     tamsulosin 0.4 MG Caps capsule  Commonly known as:  FLOMAX  Take one tablet by mouth once daily for prostate         Physical Exam: Filed Vitals:   02/24/15 1346  BP: 133/74  Pulse: 78  Temp: 97.5 F (36.4 C)  TempSrc: Oral  Resp: 20  Height: 6\' 2"  (1.88 m)  Weight: 251 lb 3.2 oz (113.944 kg)  SpO2: 97%    General- elderly male, obese, in no acute distress Head- normocephalic, atraumatic Throat- moist mucus membrane Eyes- PERRLA, EOMI, no pallor, no icterus, no discharge, normal conjunctiva, normal sclera Neck- no cervical lymphadenopathy Cardiovascular- normal s1,s2, no murmurs, palpable dorsalis pedis and radial pulses, trace left leg edema Respiratory- bilateral clear to auscultation, no wheeze, no rhonchi, no crackles, no use of accessory muscles Abdomen- bowel sounds present, soft, non tender Musculoskeletal- able to move all 4 extremities, left leg limited range of motion  Neurological- no focal deficit, alert and oriented to person,and place Skin- warm and dry, left hip surgical incision with dressing in place and dressing clean and dry Psychiatry- normal mood and affect    Labs reviewed: Basic Metabolic Panel:  Recent Labs  16/10/96 1736 02/19/15 0946 02/20/15 1509 02/21/15 0512  NA 137 137  --  135  K 4.5 4.5   --  4.4  CL 104 103  --  107  CO2 25 27  --  25  GLUCOSE 136* 147*  --  144*  BUN 22* 18  --  19  CREATININE 1.52* 1.21 1.03 1.01  CALCIUM 9.3 9.2  --  8.4*   Liver Function Tests: No results for input(s): AST, ALT, ALKPHOS, BILITOT, PROT, ALBUMIN in the last 8760 hours. No results for input(s): LIPASE, AMYLASE in the last 8760 hours. No results for input(s): AMMONIA in the  last 8760 hours. CBC:  Recent Labs  02/19/15 0946 02/20/15 1509 02/21/15 0512  WBC 15.3* 14.9* 13.5*  NEUTROABS 13.4*  --   --   HGB 13.3 13.4 11.9*  HCT 39.4 40.1 35.2*  MCV 89.5 90.7 89.8  PLT 159 146* 145*   Cardiac Enzymes: No results for input(s): CKTOTAL, CKMB, CKMBINDEX, TROPONINI in the last 8760 hours. BNP: Invalid input(s): POCBNP CBG:  Recent Labs  02/20/15 2038  GLUCAP 161*    Radiological Exams: Dg Chest Port 1 View  02/19/2015   CLINICAL DATA:  Pain following fall. Intermittent cough and shortness of breath  EXAM: PORTABLE CHEST - 1 VIEW  COMPARISON:  January 04, 2015  FINDINGS: There is no edema or consolidation. The heart size and pulmonary vascularity are normal. No pneumothorax. No adenopathy. No bone lesions.  IMPRESSION: No edema or consolidation.   Electronically Signed   By: Bretta Bang III M.D.   On: 02/19/2015 10:02   Dg Hip Operative Unilat With Pelvis Left  02/20/2015   CLINICAL DATA:  ORIF of a left proximal femur fracture.  EXAM: OPERATIVE LEFT HIP (WITH PELVIS IF PERFORMED)  VIEWS  TECHNIQUE: Fluoroscopic spot image(s) were submitted for interpretation post-operatively.  COMPARISON:  02/19/2015  FINDINGS: Femoral neck fracture has been stable lies with a compression screw and intra medullary rod. The orthopedic hardware well-seated. Fracture fragments are well aligned. There is no new fracture or evidence of an operative complication.  IMPRESSION: Well-aligned left proximal femur fracture fragments following ORIF.   Electronically Signed   By: Amie Portland M.D.   On:  02/20/2015 13:28   Dg Hip Unilat With Pelvis 2-3 Views Left  02/19/2015   CLINICAL DATA:  Pain following fall earlier today  EXAM: DG HIP (WITH OR WITHOUT PELVIS) 2-3V LEFT  COMPARISON:  None.  FINDINGS: Frontal pelvis as well as frontal and lateral left hip images were obtained. On the frontal view, there is an obliquely oriented lucency in the femoral neck region on the left with apparent cortical disruption along the lateral mid femoral neck region, concerning for incomplete fracture. A fracture is not appreciable on the lateral view, however. There is no other evidence of potential fracture. No dislocation. There is slight narrowing of both hip joints. There is extensive osteoarthritic change in the visualized lumbar spine.  IMPRESSION: Concern for fracture, obliquely oriented, in the femoral neck region, not confirmed on the lateral view. Given this appearance, correlation with CT or MR of the left hip region is advised. No other findings concerning for potential fracture. No dislocation. Mild symmetric narrowing of both hip joints. Degenerative type change in visualized lumbar spine.   Electronically Signed   By: Bretta Bang III M.D.   On: 02/19/2015 09:36   Dg Femur Min 2 Views Left  02/19/2015   CLINICAL DATA:  Fall at home today. Left hip and femur pain. Initial encounter.  EXAM: LEFT FEMUR 2 VIEWS  COMPARISON:  None.  FINDINGS: And intratrochanteric fracture is nondisplaced. A left total knee arthroplasty is noted. The knee is located. The hip is located. No additional fractures are present.  IMPRESSION: Nondisplaced intertrochanteric fracture of the left hip.   Electronically Signed   By: Marin Roberts M.D.   On: 02/19/2015 09:38    Assessment/Plan  Left femur fracture S/p surgical repair. Has follow up with orthopedics. WBAT. Will have him work with physical therapy and occupational therapy team to help with gait training and muscle strengthening exercises.fall precautions. Skin  care.  Encourage to be out of bed. Continue hydrocodone-apap 5-325 mg q6h prn pain. Continue lovenox for dvt prophylaxis. Continue vitamin d supplement.  ABLA Post op from blood loss, monitor h&h  UTI Continue and complete course of ciprofloxacin on 02/25/15. Hydration encouraged. Continue acidophilus  Leukocytosis With his recent UTI. monitor clinically  Constipation Continue senna s 2 tab at bedtime and hydration encouraged. Also on prn bisacodyl  alzhimer's dementia Stable, continue namenda  Old cva Stable, continue plavix.   Depression Stable, continue home regimen celexa and monitor  BPH  Stable, continue flomax   UI Stable, continue myrbetriq for now and monitor   Goals of care: short term rehabilitation   Labs/tests ordered: cbc, bmp  Family/ staff Communication: reviewed care plan with patient and nursing supervisor    Oneal Grout, MD  [facility] [phone] (Monday-Friday 8 am - 5 pm) [phone] (afterhours)

## 2015-03-19 ENCOUNTER — Encounter: Payer: Self-pay | Admitting: Adult Health

## 2015-03-19 ENCOUNTER — Non-Acute Institutional Stay (SKILLED_NURSING_FACILITY): Payer: Commercial Managed Care - HMO | Admitting: Adult Health

## 2015-03-19 DIAGNOSIS — G309 Alzheimer's disease, unspecified: Secondary | ICD-10-CM | POA: Diagnosis not present

## 2015-03-19 DIAGNOSIS — N400 Enlarged prostate without lower urinary tract symptoms: Secondary | ICD-10-CM | POA: Diagnosis not present

## 2015-03-19 DIAGNOSIS — F0280 Dementia in other diseases classified elsewhere without behavioral disturbance: Secondary | ICD-10-CM | POA: Diagnosis not present

## 2015-03-19 DIAGNOSIS — K5901 Slow transit constipation: Secondary | ICD-10-CM | POA: Diagnosis not present

## 2015-03-19 DIAGNOSIS — S72002S Fracture of unspecified part of neck of left femur, sequela: Secondary | ICD-10-CM | POA: Diagnosis not present

## 2015-03-19 DIAGNOSIS — Z8673 Personal history of transient ischemic attack (TIA), and cerebral infarction without residual deficits: Secondary | ICD-10-CM

## 2015-03-19 DIAGNOSIS — H409 Unspecified glaucoma: Secondary | ICD-10-CM

## 2015-03-19 DIAGNOSIS — F329 Major depressive disorder, single episode, unspecified: Secondary | ICD-10-CM | POA: Diagnosis not present

## 2015-03-19 DIAGNOSIS — E785 Hyperlipidemia, unspecified: Secondary | ICD-10-CM

## 2015-03-19 DIAGNOSIS — F32A Depression, unspecified: Secondary | ICD-10-CM

## 2015-03-20 NOTE — Progress Notes (Signed)
Patient ID: Willie Wagley., male   DOB: 1933-05-15, 81 y.o.   MRN: [id number]    DATE:  03/19/15 MRN:  [id number]  BIRTHDAY: [birth year]  Facility:  Nursing Home Location:  [facility]  Nursing Home Room Number: 104-P  LEVEL OF CARE:  SNF (31)  Contact Information    Name Relation Home Work Mobile   [place] Daughter [phone]     Roger Shelter [phone]         Chief Complaint  Patient presents with  . Discharge Note    left hip fracture S/P surgical repair, glaucoma, alzheimer's disease, hyperlipidemia, constipation, depression and BPH    HISTORY OF PRESENT ILLNESS:  This is an 81 year old male who is for discharge home with Home health PT for endurance, OT for ADLs, CNA for showers and ST for cognitive skills. She has been admitted to [facility] on 02/22/15 from [facility]. He had a fall and sustained a left femur fracture for which he had surgical repair. He has PMH of CVA, Alzheimer's disease, Hx of DVT/PE and hyperlipidemia.  Patient was admitted to this facility for short-term rehabilitation after the patient's recent hospitalization.  Patient has completed SNF rehabilitation and therapy has cleared the patient for discharge.   PAST MEDICAL HISTORY:  Past Medical History  Diagnosis Date  . Dementia   . Hypertension   . History of CVA (cerebrovascular accident)   . History of DVT (deep vein thrombosis)   . History of pulmonary embolism   . Depression   . Glaucoma   . Urge incontinence   . BPH (benign prostatic hyperplasia)     s/p surgery that "didnt work"  . Stroke   . Shortness of breath dyspnea      CURRENT MEDICATIONS: Reviewed  Patient's Medications  New Prescriptions   No medications on file  Previous Medications   BISACODYL (DULCOLAX) 5 MG EC TABLET    Take 1 tablet (5 mg total) by mouth daily as needed for moderate constipation.   BRIMONIDINE-TIMOLOL (COMBIGAN) 0.2-0.5 % OPHTHALMIC SOLUTION    Place 1  drop into both eyes every 12 (twelve) hours.   BRINZOLAMIDE (AZOPT) 1 % OPHTHALMIC SUSPENSION    Place 1 drop into the right eye 2 (two) times daily.   CITALOPRAM (CELEXA) 40 MG TABLET    Take 40 mg by mouth daily.   CLOPIDOGREL (PLAVIX) 75 MG TABLET    Take 1 tablet (75 mg total) by mouth daily.   ENOXAPARIN (LOVENOX) 30 MG/0.3ML INJECTION    Inject 0.3 mLs (30 mg total) into the skin daily. Please take for DVT prophylaxis for 3 weeks then stop   HYDROCODONE-ACETAMINOPHEN (NORCO) 5-325 MG PER TABLET    Take 1 tablet by mouth every 6 (six) hours as needed for moderate pain.   LACTOBACILLUS ACIDOPHILUS & BULGAR (LACTINEX) CHEWABLE TABLET    Chew 1 tablet by mouth 3 (three) times daily with meals. Take for 10 days and then stop   MEMANTINE (NAMENDA) 10 MG TABLET    TAKE ONE TABLET BY MOUTH TWICE DAILY   MIRABEGRON ER (MYRBETRIQ) 50 MG TB24 TABLET    Take one tablet by mouth once daily for bladder   MULTIPLE VITAMIN (MULTIVITAMIN WITH MINERALS) TABS TABLET    Take 1 tablet by mouth daily.   ONDANSETRON (ZOFRAN) 4 MG TABLET    Take 1 tablet (4 mg total) by mouth every 6 (six) hours as needed for nausea.   PRAVASTATIN (PRAVACHOL) 20 MG TABLET  TAKE ONE TABLET BY MOUTH ONCE DAILY   SENNA-DOCUSATE (SENOKOT-S) 8.6-50 MG PER TABLET    Take 1 tablet by mouth at bedtime as needed for mild constipation.   TAMSULOSIN (FLOMAX) 0.4 MG CAPS CAPSULE    Take one tablet by mouth once daily for prostate  Modified Medications   No medications on file  Discontinued Medications   No medications on file     No Known Allergies   REVIEW OF SYSTEMS:  GENERAL: no change in appetite, no fatigue, no weight changes, no fever, chills or weakness EYES: Denies change in vision, dry eyes, eye pain, itching or discharge EARS: Denies change in hearing, ringing in ears, or earache NOSE: Denies nasal congestion or epistaxis MOUTH and THROAT: Denies oral discomfort, gingival pain or bleeding, pain from teeth or hoarseness     RESPIRATORY: no cough, SOB, DOE, wheezing, hemoptysis CARDIAC: no chest pain, edema or palpitations GI: no abdominal pain, diarrhea, constipation, heart burn, nausea or vomiting GU: Denies dysuria, frequency, hematuria, incontinence, or discharge PSYCHIATRIC: Denies feeling of depression or anxiety. No report of hallucinations, insomnia, paranoia, or agitation   PHYSICAL EXAMINATION  GENERAL APPEARANCE: Well nourished. In no acute distress. Normal body habitus SKIN:  Left lateral thigh incision is dry,  no erythema HEAD: Normal in size and contour. No evidence of trauma EYES: Lids open and close normally. No blepharitis, entropion or ectropion. PERRL. Conjunctivae are clear and sclerae are white. Lenses are without opacity EARS: Pinnae are normal. Patient hears normal voice tunes of the examiner MOUTH and THROAT: Lips are without lesions. Oral mucosa is moist and without lesions. Tongue is normal in shape, size, and color and without lesions NECK: supple, trachea midline, no neck masses, no thyroid tenderness, no thyromegaly LYMPHATICS: no LAN in the neck, no supraclavicular LAN RESPIRATORY: breathing is even & unlabored, BS CTAB CARDIAC: RRR, no murmur,no extra heart sounds, no edema GI: abdomen soft, normal BS, no masses, no tenderness, no hepatomegaly, no splenomegaly EXTREMITIES:  Able to move X 4 extremities PSYCHIATRIC: Alert and oriented X 3. Affect and behavior are appropriate  LABS/RADIOLOGY: Labs reviewed: 03/15/15  WBC 7.7 hemoglobin 12.1 hematocrit 38.5 MCV 93.9 platelet count 225 02/25/15  WBC 9.3 hemoglobin 10.9 hematocrit 33.1 MCV 89.9 platelet count 100 8D sodium 140 potassium 4.3 glucose 95 BUN 17 creatinine 0.99 creatinine 0.99 calcium 8.1 Basic Metabolic Panel:  Recent Labs  16/10/96 1736 02/19/15 0946 02/20/15 1509 02/21/15 0512  NA 137 137  --  135  K 4.5 4.5  --  4.4  CL 104 103  --  107  CO2 25 27  --  25  GLUCOSE 136* 147*  --  144*  BUN 22* 18  --  19   CREATININE 1.52* 1.21 1.03 1.01  CALCIUM 9.3 9.2  --  8.4*   CBC:  Recent Labs  02/19/15 0946 02/20/15 1509 02/21/15 0512  WBC 15.3* 14.9* 13.5*  NEUTROABS 13.4*  --   --   HGB 13.3 13.4 11.9*  HCT 39.4 40.1 35.2*  MCV 89.5 90.7 89.8  PLT 159 146* 145*    CBG:  Recent Labs  02/20/15 2038  GLUCAP 161*    Dg Chest Port 1 View  02/19/2015   CLINICAL DATA:  Pain following fall. Intermittent cough and shortness of breath  EXAM: PORTABLE CHEST - 1 VIEW  COMPARISON:  January 04, 2015  FINDINGS: There is no edema or consolidation. The heart size and pulmonary vascularity are normal. No pneumothorax. No adenopathy. No bone  lesions.  IMPRESSION: No edema or consolidation.   Electronically Signed   By: Bretta Bang III M.D.   On: 02/19/2015 10:02   Dg Hip Operative Unilat With Pelvis Left  02/20/2015   CLINICAL DATA:  ORIF of a left proximal femur fracture.  EXAM: OPERATIVE LEFT HIP (WITH PELVIS IF PERFORMED)  VIEWS  TECHNIQUE: Fluoroscopic spot image(s) were submitted for interpretation post-operatively.  COMPARISON:  02/19/2015  FINDINGS: Femoral neck fracture has been stable lies with a compression screw and intra medullary rod. The orthopedic hardware well-seated. Fracture fragments are well aligned. There is no new fracture or evidence of an operative complication.  IMPRESSION: Well-aligned left proximal femur fracture fragments following ORIF.   Electronically Signed   By: Amie Portland M.D.   On: 02/20/2015 13:28   Dg Hip Unilat With Pelvis 2-3 Views Left  02/19/2015   CLINICAL DATA:  Pain following fall earlier today  EXAM: DG HIP (WITH OR WITHOUT PELVIS) 2-3V LEFT  COMPARISON:  None.  FINDINGS: Frontal pelvis as well as frontal and lateral left hip images were obtained. On the frontal view, there is an obliquely oriented lucency in the femoral neck region on the left with apparent cortical disruption along the lateral mid femoral neck region, concerning for incomplete fracture. A  fracture is not appreciable on the lateral view, however. There is no other evidence of potential fracture. No dislocation. There is slight narrowing of both hip joints. There is extensive osteoarthritic change in the visualized lumbar spine.  IMPRESSION: Concern for fracture, obliquely oriented, in the femoral neck region, not confirmed on the lateral view. Given this appearance, correlation with CT or MR of the left hip region is advised. No other findings concerning for potential fracture. No dislocation. Mild symmetric narrowing of both hip joints. Degenerative type change in visualized lumbar spine.   Electronically Signed   By: Bretta Bang III M.D.   On: 02/19/2015 09:36   Dg Femur Min 2 Views Left  02/19/2015   CLINICAL DATA:  Fall at home today. Left hip and femur pain. Initial encounter.  EXAM: LEFT FEMUR 2 VIEWS  COMPARISON:  None.  FINDINGS: And intratrochanteric fracture is nondisplaced. A left total knee arthroplasty is noted. The knee is located. The hip is located. No additional fractures are present.  IMPRESSION: Nondisplaced intertrochanteric fracture of the left hip.   Electronically Signed   By: Marin Roberts M.D.   On: 02/19/2015 09:38    ASSESSMENT/PLAN:  Left hip fracture S/P surgical repair - for home health PT, OT, ST and CNA; RLE WBAT;  Norco 5/325 mg 1 tab PO Q 6 hours PRN for pain; follow-up with Dr. Ophelia Charter, orthopedic surgeon  Hx of CVA - continue Plavix 75 mg 1 tab PO Q D  Glaucoma - continue Brinzolamide  and Combigan eye gtts  Alzheimer's disease - continue Namenda 10 mg 1 tab PO BID  Hyperlipidemia - continue Pravastatin 20 mg 1 tab PO Q D  Constipation - continue Dulcolax and Senna-S PRN  Depression - mood is stable; continue Celexa 40 mg 1 tab PO Q D  BPH - continue Flomax 0.4 mg 1 capsule PO Q AM      I have filled out patient's discharge paperwork and written prescriptions.  Patient will receive home health PT, OT, ST and CNA.  Total  discharge time: Less than 30 minutes  Discharge time involved coordination of the discharge process with Child psychotherapist, nursing staff and therapy department. Medical justification for home health  services verified.    [facility], NP [employer] [phone]

## 2015-03-21 DIAGNOSIS — H409 Unspecified glaucoma: Secondary | ICD-10-CM | POA: Diagnosis not present

## 2015-03-21 DIAGNOSIS — S72002D Fracture of unspecified part of neck of left femur, subsequent encounter for closed fracture with routine healing: Secondary | ICD-10-CM | POA: Diagnosis not present

## 2015-03-21 DIAGNOSIS — M6281 Muscle weakness (generalized): Secondary | ICD-10-CM | POA: Diagnosis not present

## 2015-03-21 DIAGNOSIS — F0390 Unspecified dementia without behavioral disturbance: Secondary | ICD-10-CM | POA: Diagnosis not present

## 2015-04-05 ENCOUNTER — Other Ambulatory Visit: Payer: Self-pay

## 2015-04-05 DIAGNOSIS — F0280 Dementia in other diseases classified elsewhere without behavioral disturbance: Secondary | ICD-10-CM

## 2015-04-05 DIAGNOSIS — G309 Alzheimer's disease, unspecified: Principal | ICD-10-CM

## 2015-04-05 MED ORDER — MEMANTINE HCL 10 MG PO TABS: 10.0000 mg | ORAL_TABLET | Freq: Two times a day (BID) | ORAL | Status: DC

## 2015-04-08 ENCOUNTER — Other Ambulatory Visit: Payer: Self-pay

## 2015-04-08 DIAGNOSIS — G309 Alzheimer's disease, unspecified: Principal | ICD-10-CM

## 2015-04-08 DIAGNOSIS — F0280 Dementia in other diseases classified elsewhere without behavioral disturbance: Secondary | ICD-10-CM

## 2015-04-08 MED ORDER — MEMANTINE HCL 10 MG PO TABS: 10.0000 mg | ORAL_TABLET | Freq: Two times a day (BID) | ORAL | Status: DC

## 2015-06-17 DIAGNOSIS — I447 Left bundle-branch block, unspecified: Secondary | ICD-10-CM

## 2015-06-17 HISTORY — DX: Left bundle-branch block, unspecified: I44.7

## 2015-06-22 ENCOUNTER — Emergency Department (HOSPITAL_COMMUNITY): Payer: Commercial Managed Care - HMO

## 2015-06-22 ENCOUNTER — Observation Stay (HOSPITAL_COMMUNITY)
Admission: EM | Admit: 2015-06-22 | Discharge: 2015-06-25 | Disposition: A | Discharge status: Home / Self Care | Payer: Commercial Managed Care - HMO | Attending: Internal Medicine | Admitting: Internal Medicine

## 2015-06-22 ENCOUNTER — Encounter (HOSPITAL_COMMUNITY): Payer: Self-pay

## 2015-06-22 DIAGNOSIS — F0280 Dementia in other diseases classified elsewhere without behavioral disturbance: Secondary | ICD-10-CM | POA: Diagnosis not present

## 2015-06-22 DIAGNOSIS — I129 Hypertensive chronic kidney disease with stage 1 through stage 4 chronic kidney disease, or unspecified chronic kidney disease: Secondary | ICD-10-CM | POA: Diagnosis not present

## 2015-06-22 DIAGNOSIS — R32 Unspecified urinary incontinence: Secondary | ICD-10-CM | POA: Insufficient documentation

## 2015-06-22 DIAGNOSIS — Z9181 History of falling: Secondary | ICD-10-CM | POA: Insufficient documentation

## 2015-06-22 DIAGNOSIS — F329 Major depressive disorder, single episode, unspecified: Secondary | ICD-10-CM

## 2015-06-22 DIAGNOSIS — F0390 Unspecified dementia without behavioral disturbance: Secondary | ICD-10-CM

## 2015-06-22 DIAGNOSIS — R001 Bradycardia, unspecified: Secondary | ICD-10-CM | POA: Diagnosis present

## 2015-06-22 DIAGNOSIS — Z66 Do not resuscitate: Secondary | ICD-10-CM | POA: Diagnosis not present

## 2015-06-22 DIAGNOSIS — Z87891 Personal history of nicotine dependence: Secondary | ICD-10-CM | POA: Insufficient documentation

## 2015-06-22 DIAGNOSIS — I441 Atrioventricular block, second degree: Secondary | ICD-10-CM | POA: Insufficient documentation

## 2015-06-22 DIAGNOSIS — I452 Bifascicular block: Principal | ICD-10-CM | POA: Insufficient documentation

## 2015-06-22 DIAGNOSIS — Z8673 Personal history of transient ischemic attack (TIA), and cerebral infarction without residual deficits: Secondary | ICD-10-CM | POA: Insufficient documentation

## 2015-06-22 DIAGNOSIS — H918X3 Other specified hearing loss, bilateral: Secondary | ICD-10-CM | POA: Insufficient documentation

## 2015-06-22 DIAGNOSIS — R05 Cough: Secondary | ICD-10-CM | POA: Diagnosis not present

## 2015-06-22 DIAGNOSIS — R5383 Other fatigue: Secondary | ICD-10-CM | POA: Diagnosis present

## 2015-06-22 DIAGNOSIS — G309 Alzheimer's disease, unspecified: Secondary | ICD-10-CM | POA: Diagnosis not present

## 2015-06-22 DIAGNOSIS — I951 Orthostatic hypotension: Secondary | ICD-10-CM | POA: Insufficient documentation

## 2015-06-22 DIAGNOSIS — H9193 Unspecified hearing loss, bilateral: Secondary | ICD-10-CM | POA: Diagnosis present

## 2015-06-22 DIAGNOSIS — N183 Chronic kidney disease, stage 3 unspecified: Secondary | ICD-10-CM

## 2015-06-22 DIAGNOSIS — R55 Syncope and collapse: Secondary | ICD-10-CM | POA: Diagnosis present

## 2015-06-22 DIAGNOSIS — E785 Hyperlipidemia, unspecified: Secondary | ICD-10-CM | POA: Diagnosis not present

## 2015-06-22 DIAGNOSIS — Z86711 Personal history of pulmonary embolism: Secondary | ICD-10-CM | POA: Insufficient documentation

## 2015-06-22 DIAGNOSIS — Z86718 Personal history of other venous thrombosis and embolism: Secondary | ICD-10-CM | POA: Insufficient documentation

## 2015-06-22 DIAGNOSIS — H409 Unspecified glaucoma: Secondary | ICD-10-CM | POA: Insufficient documentation

## 2015-06-22 DIAGNOSIS — F32A Depression, unspecified: Secondary | ICD-10-CM | POA: Diagnosis present

## 2015-06-22 DIAGNOSIS — N401 Enlarged prostate with lower urinary tract symptoms: Secondary | ICD-10-CM | POA: Insufficient documentation

## 2015-06-22 DIAGNOSIS — I10 Essential (primary) hypertension: Secondary | ICD-10-CM

## 2015-06-22 DIAGNOSIS — I447 Left bundle-branch block, unspecified: Secondary | ICD-10-CM | POA: Diagnosis present

## 2015-06-22 HISTORY — DX: Left bundle-branch block, unspecified: I44.7

## 2015-06-22 LAB — CBC
HEMATOCRIT: 39.1 % (ref 39.0–52.0)
Hemoglobin: 12.7 g/dL — ABNORMAL LOW (ref 13.0–17.0)
MCH: 28.8 pg (ref 26.0–34.0)
MCHC: 32.5 g/dL (ref 30.0–36.0)
MCV: 88.7 fL (ref 78.0–100.0)
Platelets: 164 10*3/uL (ref 150–400)
RBC: 4.41 MIL/uL (ref 4.22–5.81)
RDW: 13.7 % (ref 11.5–15.5)
WBC: 7.8 10*3/uL (ref 4.0–10.5)

## 2015-06-22 LAB — BASIC METABOLIC PANEL
Anion gap: 8 (ref 5–15)
BUN: 19 mg/dL (ref 6–20)
CHLORIDE: 102 mmol/L (ref 101–111)
CO2: 27 mmol/L (ref 22–32)
CREATININE: 1.3 mg/dL — AB (ref 0.61–1.24)
Calcium: 9.1 mg/dL (ref 8.9–10.3)
GFR calc Af Amer: 58 mL/min — ABNORMAL LOW (ref >60)
GFR calc non Af Amer: 50 mL/min — ABNORMAL LOW (ref >60)
GLUCOSE: 112 mg/dL — AB (ref 65–99)
POTASSIUM: 4.6 mmol/L (ref 3.5–5.1)
SODIUM: 137 mmol/L (ref 135–145)

## 2015-06-22 LAB — I-STAT TROPONIN, ED: Troponin i, poc: 0 ng/mL (ref 0.00–0.08)

## 2015-06-22 LAB — MAGNESIUM: Magnesium: 1.9 mg/dL (ref 1.7–2.4)

## 2015-06-22 LAB — PHOSPHORUS: PHOSPHORUS: 3.8 mg/dL (ref 2.5–4.6)

## 2015-06-22 MED ORDER — SODIUM CHLORIDE 0.9 % IV SOLN
INTRAVENOUS | Status: DC
  Administered 2015-06-22: 21:00:00 via INTRAVENOUS

## 2015-06-22 MED ORDER — TIMOLOL MALEATE 0.5 % OP SOLN
1.0000 [drp] | Freq: Two times a day (BID) | OPHTHALMIC | Status: DC
  Administered 2015-06-22: 1 [drp] via OPHTHALMIC
  Filled 2015-06-22: qty 5

## 2015-06-22 MED ORDER — CLOPIDOGREL BISULFATE 75 MG PO TABS
75.0000 mg | ORAL_TABLET | Freq: Every day | ORAL | Status: DC
  Administered 2015-06-22 – 2015-06-24 (×3): 75 mg via ORAL
  Filled 2015-06-22 (×3): qty 1

## 2015-06-22 MED ORDER — VITAMIN D (ERGOCALCIFEROL) 1.25 MG (50000 UNIT) PO CAPS
50000.0000 [IU] | ORAL_CAPSULE | Freq: Every day | ORAL | Status: DC
  Administered 2015-06-23 – 2015-06-24 (×2): 50000 [IU] via ORAL
  Filled 2015-06-22 (×3): qty 1

## 2015-06-22 MED ORDER — TAMSULOSIN HCL 0.4 MG PO CAPS
0.4000 mg | ORAL_CAPSULE | Freq: Every day | ORAL | Status: DC
  Administered 2015-06-23 – 2015-06-25 (×3): 0.4 mg via ORAL
  Filled 2015-06-22 (×3): qty 1

## 2015-06-22 MED ORDER — ACETAMINOPHEN 325 MG PO TABS: 650.0000 mg | ORAL_TABLET | Freq: Four times a day (QID) | ORAL | Status: DC | PRN

## 2015-06-22 MED ORDER — BRIMONIDINE TARTRATE-TIMOLOL 0.2-0.5 % OP SOLN: 1.0000 [drp] | Freq: Two times a day (BID) | OPHTHALMIC | Status: DC

## 2015-06-22 MED ORDER — PRAVASTATIN SODIUM 20 MG PO TABS
20.0000 mg | ORAL_TABLET | Freq: Every day | ORAL | Status: DC
  Administered 2015-06-23 – 2015-06-25 (×3): 20 mg via ORAL
  Filled 2015-06-22 (×3): qty 1

## 2015-06-22 MED ORDER — ACETAMINOPHEN 650 MG RE SUPP: 650.0000 mg | Freq: Four times a day (QID) | RECTAL | Status: DC | PRN

## 2015-06-22 MED ORDER — ONDANSETRON HCL 4 MG/2ML IJ SOLN: 4.0000 mg | Freq: Four times a day (QID) | INTRAMUSCULAR | Status: DC | PRN

## 2015-06-22 MED ORDER — SODIUM CHLORIDE 0.9 % IV BOLUS (SEPSIS)
1000.0000 mL | Freq: Once | INTRAVENOUS | Status: AC
  Administered 2015-06-22: 1000 mL via INTRAVENOUS

## 2015-06-22 MED ORDER — ADULT MULTIVITAMIN W/MINERALS CH
1.0000 | ORAL_TABLET | Freq: Every day | ORAL | Status: DC
  Administered 2015-06-22 – 2015-06-24 (×3): 1 via ORAL
  Filled 2015-06-22 (×3): qty 1

## 2015-06-22 MED ORDER — MIRABEGRON ER 50 MG PO TB24
50.0000 mg | ORAL_TABLET | Freq: Every day | ORAL | Status: DC
  Administered 2015-06-23 – 2015-06-25 (×3): 50 mg via ORAL
  Filled 2015-06-22 (×3): qty 1

## 2015-06-22 MED ORDER — BRIMONIDINE TARTRATE 0.2 % OP SOLN
1.0000 [drp] | Freq: Two times a day (BID) | OPHTHALMIC | Status: DC
  Administered 2015-06-22: 1 [drp] via OPHTHALMIC
  Filled 2015-06-22: qty 5

## 2015-06-22 MED ORDER — ONDANSETRON HCL 4 MG PO TABS: 4.0000 mg | ORAL_TABLET | Freq: Four times a day (QID) | ORAL | Status: DC | PRN

## 2015-06-22 MED ORDER — ENOXAPARIN SODIUM 40 MG/0.4ML ~~LOC~~ SOLN
40.0000 mg | SUBCUTANEOUS | Status: DC
  Administered 2015-06-22 – 2015-06-24 (×3): 40 mg via SUBCUTANEOUS
  Filled 2015-06-22 (×3): qty 0.4

## 2015-06-22 MED ORDER — BRINZOLAMIDE 1 % OP SUSP
1.0000 [drp] | Freq: Two times a day (BID) | OPHTHALMIC | Status: DC
  Administered 2015-06-22 – 2015-06-25 (×5): 1 [drp] via OPHTHALMIC
  Filled 2015-06-22: qty 10

## 2015-06-22 MED ORDER — MEMANTINE HCL 10 MG PO TABS
10.0000 mg | ORAL_TABLET | Freq: Two times a day (BID) | ORAL | Status: DC
  Administered 2015-06-22: 10 mg via ORAL
  Filled 2015-06-22 (×2): qty 1

## 2015-06-22 MED ORDER — SODIUM CHLORIDE 0.9 % IJ SOLN
3.0000 mL | Freq: Two times a day (BID) | INTRAMUSCULAR | Status: DC
  Administered 2015-06-22 – 2015-06-25 (×4): 3 mL via INTRAVENOUS

## 2015-06-22 MED ORDER — CITALOPRAM HYDROBROMIDE 20 MG PO TABS
40.0000 mg | ORAL_TABLET | Freq: Every day | ORAL | Status: DC
  Administered 2015-06-23: 40 mg via ORAL
  Filled 2015-06-22: qty 2

## 2015-06-22 NOTE — ED Provider Notes (Signed)
CSN: [id number]     Arrival date & time 06/22/15  1703 History   First MD Initiated Contact with Patient 06/22/15 1751     Chief Complaint  Patient presents with  . Loss of Consciousness     (Consider location/radiation/quality/duration/timing/severity/associated sxs/prior Treatment) HPI  81 year old male presents with a chief complaint of fatigue and possible syncope. Patient's history is obtained from the daughter as the patient has a history of dementia. 6 days ago he became dizzy and fell, landing on his elbow. He did not pass out but it seemed like he might. 4 days ago the patient had EMS called at his nursing this only because he was hard to arouse. He stated he felt tired and went to his bed and then had a hard time arousing him for about 30 minutes. Family is concerned that he was passed out. When EMS arrived he was acting normally and had a normal EKG and thus was not transported. When he saw his PCP for follow-up today the sent him here. Patient has not had any complaints of chest pain. No fevers, diarrhea, or other new symptoms. Has a chronic cough that is not worse than typical. Patient's only complaint to me is that he feels fatigued and has no energy.  Past Medical History  Diagnosis Date  . Dementia   . Hypertension   . History of CVA (cerebrovascular accident)   . History of DVT (deep vein thrombosis)   . History of pulmonary embolism   . Depression   . Glaucoma   . Urge incontinence   . BPH (benign prostatic hyperplasia)     s/p surgery that "didnt work"  . Stroke (HCC)   . Shortness of breath dyspnea    Past Surgical History  Procedure Laterality Date  . Replacement total knee bilateral    . Bph surgery    . Joint replacement    . Intramedullary (im) nail intertrochanteric Left 02/20/2015    Procedure: LEFT  INTERTROCHANTRIC HIP FRACTURE;  Surgeon: Eldred Manges, MD;  Location: [facility];  Service: Orthopedics;  Laterality: Left;   Family History  Problem Relation Age  of Onset  . Alcoholism Father     died in 25s related to complciations  . Heart failure Mother   . Arrhythmia Mother    Social History  Substance Use Topics  . Smoking status: Former Smoker -- 15.00 packs/day    Quit date: 07/17/1965  . Smokeless tobacco: Never Used  . Alcohol Use: Yes     Comment: occasional gin and tonic    Review of Systems  Unable to perform ROS: Dementia      Allergies  Review of patient's allergies indicates no known allergies.  Home Medications   Prior to Admission medications   Medication Sig Start Date End Date Taking? Authorizing Provider  brimonidine-timolol (COMBIGAN) 0.2-0.5 % ophthalmic solution Place 1 drop into both eyes every 12 (twelve) hours.   Yes Historical Provider, MD  brinzolamide (AZOPT) 1 % ophthalmic suspension Place 1 drop into the right eye 2 (two) times daily.   Yes Historical Provider, MD  citalopram (CELEXA) 40 MG tablet Take 40 mg by mouth daily.   Yes Historical Provider, MD  clopidogrel (PLAVIX) 75 MG tablet Take 1 tablet (75 mg total) by mouth daily. Patient taking differently: Take 75 mg by mouth at bedtime.  08/14/13  Yes Myra Rude, MD  memantine (NAMENDA) 10 MG tablet Take 1 tablet (10 mg total) by mouth 2 (two) times daily.  04/08/15  Yes Adam Mliss Fritz, DO  mirabegron ER (MYRBETRIQ) 50 MG TB24 tablet Take 50 mg by mouth daily.    Yes Historical Provider, MD  Multiple Vitamin (MULTIVITAMIN WITH MINERALS) TABS tablet Take 1 tablet by mouth at bedtime.    Yes Historical Provider, MD  pravastatin (PRAVACHOL) 20 MG tablet TAKE ONE TABLET BY MOUTH ONCE DAILY Patient taking differently: Take one tablet by mouth once daily for cholesterol at bedtime 06/30/14  Yes Myra Rude, MD  tamsulosin (FLOMAX) 0.4 MG CAPS capsule Take 0.4 mg by mouth daily after breakfast. Take one tablet by mouth once daily for prostate   Yes Historical Provider, MD  Vitamin D, Ergocalciferol, (DRISDOL) 50000 UNITS CAPS capsule Take 50,000 Units by  mouth daily.   Yes Historical Provider, MD  bisacodyl (DULCOLAX) 5 MG EC tablet Take 1 tablet (5 mg total) by mouth daily as needed for moderate constipation. Patient not taking: Reported on 06/22/2015 02/22/15   Leana Roe Elgergawy, MD  enoxaparin (LOVENOX) 30 MG/0.3ML injection Inject 0.3 mLs (30 mg total) into the skin daily. Please take for DVT prophylaxis for 3 weeks then stop 02/22/15 03/15/15  Starleen Arms, MD  HYDROcodone-acetaminophen (NORCO) 5-325 MG per tablet Take 1 tablet by mouth every 6 (six) hours as needed for moderate pain. Patient not taking: Reported on 06/22/2015 02/20/15   Eldred Manges, MD  lactobacillus acidophilus & bulgar (LACTINEX) chewable tablet Chew 1 tablet by mouth 3 (three) times daily with meals. Take for 10 days and then stop Patient not taking: Reported on 06/22/2015 02/22/15   Leana Roe Elgergawy, MD  ondansetron (ZOFRAN) 4 MG tablet Take 1 tablet (4 mg total) by mouth every 6 (six) hours as needed for nausea. Patient not taking: Reported on 06/22/2015 02/22/15   Leana Roe Elgergawy, MD  senna-docusate (SENOKOT-S) 8.6-50 MG per tablet Take 1 tablet by mouth at bedtime as needed for mild constipation. Patient not taking: Reported on 06/22/2015 02/22/15   Leana Roe Elgergawy, MD   BP 142/88 mmHg  Pulse 60  Temp(Src) 98 F (36.7 C) (Oral)  Resp 13  Ht 6\' 2"  (1.88 m)  Wt 239 lb (108.41 kg)  BMI 30.67 kg/m2  SpO2 99% Physical Exam  Constitutional: He appears well-developed and well-nourished.  HENT:  Head: Normocephalic and atraumatic.  Right Ear: External ear normal.  Left Ear: External ear normal.  Nose: Nose normal.  Eyes: EOM are normal. Pupils are equal, round, and reactive to light. Right eye exhibits no discharge. Left eye exhibits no discharge.  Neck: Neck supple.  Cardiovascular: Normal rate, regular rhythm, normal heart sounds and intact distal pulses.   Pulmonary/Chest: Effort normal and breath sounds normal.  Abdominal: Soft. There is no tenderness.   Musculoskeletal: He exhibits no edema.  Neurological: He is alert.  CN 2-12 grossly intact. 5/5 strength in all 4 extremities. Alert to self, place and time.  Skin: Skin is warm and dry.  Nursing note and vitals reviewed.   ED Course  Procedures (including critical care time) Labs Review Labs Reviewed  BASIC METABOLIC PANEL - Abnormal; Notable for the following:    Glucose, Bld 112 (*)    Creatinine, Ser 1.30 (*)    GFR calc non Af Amer 50 (*)    GFR calc Af Amer 58 (*)    All other components within normal limits  CBC - Abnormal; Notable for the following:    Hemoglobin 12.7 (*)    All other components within normal limits  MAGNESIUM  Rosezena Sensor, ED    Imaging Review Dg Chest Port 1 View  06/22/2015  CLINICAL DATA:  Cough.  Dementia. EXAM: PORTABLE CHEST 1 VIEW COMPARISON:  02/19/2015 FINDINGS: No cardiomegaly.  Stable normal aortic contour.  Negative hila. Chronic mild interstitial coarsening without focal pneumonia or edema. No effusion or pneumothorax. IMPRESSION: Stable.  No evidence of acute cardiopulmonary disease. Electronically Signed   By: Marnee Spring M.D.   On: 06/22/2015 19:00   I have personally reviewed and evaluated these images and lab results as part of my medical decision-making.   EKG Interpretation   Date/Time:  Tuesday June 22 2015 17:21:26 EST Ventricular Rate:  66 PR Interval:  220 QRS Duration: 173 QT Interval:  523 QTC Calculation: 548 R Axis:   23 Text Interpretation:  Sinus rhythm Sinus pause Prolonged PR interval Left  bundle branch block Confirmed by Arli Bree  MD, Heavyn Yearsley ([id number]) on 06/22/2015  6:15:39 PM      MDM   Final diagnoses:  None  Syncope  81 year old male with possible syncope. Has been having generalized fatigue for several weeks. Discussed his EKG with cardiology on-call, Dr. Excell Seltzer, who feels this may warrant a pacemaker given his intermittent sinus on with new left bundle branch block. A few months ago he had a  right bundle branch block and with his alternating bundle branch block and may need a pacemaker. Complicated by his dementia and goals of care. No signs of poor perfusion or need for temporary wire. Admit to hospitalist, EP consult in AM.    Pricilla Loveless, MD 06/23/15 [phone]

## 2015-06-22 NOTE — ED Notes (Signed)
Pt. Given food/drink with permission of EDP. Pt. Passed stroke swallow screen.

## 2015-06-22 NOTE — ED Notes (Signed)
[facility]-pt coming from doctor's office c/o syncope. Syncopal episode Saturday and pt. Refused to be transferred to ED. Pt. Seen this morning and advised by doctor to be transported here. Pt. Had sinus pause/LBBB/1st degree heart block en route per EMS. Pt. Denies CP/SOB/N/V. Grandmother hx of pacemaker.

## 2015-06-22 NOTE — H&P (Signed)
[facility] History and Physical  Willie Mayer. RUE:[id number] DOB: Dec 22, 1932 DOA: 06/22/2015   Referring physician: ED  PCP: Gaye Alken, MD   Chief Complaint: Fatigue  HPI:  Patient is a 81 year old male with a past medical history of dementia, CVA, BPH, depression; who presents for symptoms of fatigue and possible syncope. Patient is a resident of a nursing facility and his daughter provides his history as he has dementia and is unable to do so for himself. States that approximately 6 days ago on he had a fall in the bathroom and had hit his right elbow. Patient was noted to be alert following the event with no known losses consciousness. He was however noted to be nonverbal then had some confused speech. They denied any seizure-like activity. At baseline he is incontinent of urine and has been so for multiple months. Over the last week he's become more lethargic spending all of his time in the bed. They've been checking his pulse rate and it's been in the 40s at times. The daughter states that he's been more tired over the last few months, but now the patient also states that he doesn't want to get out of bed and that he has no energy.  Upon admission into the emergency department EKG revealed sinus rhythm with a sinus pause and a new left bundle branch block. When compared to previous EKG tracings he  had a right bundle branch block and with his alternating bundle branch block.  Heart rates on the monitor as low as 37. Cardiology consulted suggest patient may need a pacemaker. Discussed with daughter who is power of attorney and wishes to have more information regarding pacemaker and possible benefits. She is wanting to do ultimately was best,  but with patient's history of dementia is unsure. Code status  Is also discussed and he is a  DO NOT RESUSCITATE.     Review of Systems  Constitutional: Positive for malaise/fatigue. Negative for fever and chills.  HENT: Positive  for hearing loss. Negative for nosebleeds.   Eyes: Negative for pain and discharge.  Respiratory: Negative for cough and shortness of breath.   Cardiovascular: Negative for chest pain and leg swelling.  Gastrointestinal: Negative for abdominal pain and diarrhea.  Genitourinary: Positive for frequency. Negative for hematuria.       Urinary incontinence  Musculoskeletal: Positive for joint pain and falls.  Skin: Negative for itching and rash.  Neurological: Positive for weakness. Negative for focal weakness and seizures.  Endo/Heme/Allergies: Negative for polydipsia. Bruises/bleeds easily.  Psychiatric/Behavioral: Negative for suicidal ideas and substance abuse.      Past Medical History  Diagnosis Date  . Dementia   . Hypertension   . History of CVA (cerebrovascular accident)   . History of DVT (deep vein thrombosis)   . History of pulmonary embolism   . Depression   . Glaucoma   . Urge incontinence   . BPH (benign prostatic hyperplasia)     s/p surgery that "didnt work"  . Stroke (HCC)   . Shortness of breath dyspnea      Past Surgical History  Procedure Laterality Date  . Replacement total knee bilateral    . Bph surgery    . Joint replacement    . Intramedullary (im) nail intertrochanteric Left 02/20/2015    Procedure: LEFT  INTERTROCHANTRIC HIP FRACTURE;  Surgeon: Eldred Manges, MD;  Location: [facility];  Service: Orthopedics;  Laterality: Left;      Social History:  reports that  he quit smoking about 49 years ago. He has never used smokeless tobacco. He reports that he drinks alcohol. He reports that he does not use illicit drugs. Where does patient live-- SNF Can patient participate in ADLs? Needs assistance  No Known Allergies  Family History  Problem Relation Age of Onset  . Alcoholism Father     died in 1s related to complciations  . Heart failure Mother   . Arrhythmia Mother         Prior to Admission medications   Medication Sig Start Date End Date  Taking? Authorizing Provider  brimonidine-timolol (COMBIGAN) 0.2-0.5 % ophthalmic solution Place 1 drop into both eyes every 12 (twelve) hours.   Yes Historical Provider, MD  brinzolamide (AZOPT) 1 % ophthalmic suspension Place 1 drop into the right eye 2 (two) times daily.   Yes Historical Provider, MD  citalopram (CELEXA) 40 MG tablet Take 40 mg by mouth daily.   Yes Historical Provider, MD  clopidogrel (PLAVIX) 75 MG tablet Take 1 tablet (75 mg total) by mouth daily. Patient taking differently: Take 75 mg by mouth at bedtime.  08/14/13  Yes Myra Rude, MD  memantine (NAMENDA) 10 MG tablet Take 1 tablet (10 mg total) by mouth 2 (two) times daily. 04/08/15  Yes Adam Mliss Fritz, DO  mirabegron ER (MYRBETRIQ) 50 MG TB24 tablet Take 50 mg by mouth daily.    Yes Historical Provider, MD  Multiple Vitamin (MULTIVITAMIN WITH MINERALS) TABS tablet Take 1 tablet by mouth at bedtime.    Yes Historical Provider, MD  pravastatin (PRAVACHOL) 20 MG tablet TAKE ONE TABLET BY MOUTH ONCE DAILY Patient taking differently: Take one tablet by mouth once daily for cholesterol at bedtime 06/30/14  Yes Myra Rude, MD  tamsulosin (FLOMAX) 0.4 MG CAPS capsule Take 0.4 mg by mouth daily after breakfast. Take one tablet by mouth once daily for prostate   Yes Historical Provider, MD  Vitamin D, Ergocalciferol, (DRISDOL) 50000 UNITS CAPS capsule Take 50,000 Units by mouth daily.   Yes Historical Provider, MD  bisacodyl (DULCOLAX) 5 MG EC tablet Take 1 tablet (5 mg total) by mouth daily as needed for moderate constipation. Patient not taking: Reported on 06/22/2015 02/22/15   Leana Roe Elgergawy, MD  enoxaparin (LOVENOX) 30 MG/0.3ML injection Inject 0.3 mLs (30 mg total) into the skin daily. Please take for DVT prophylaxis for 3 weeks then stop 02/22/15 03/15/15  Starleen Arms, MD  HYDROcodone-acetaminophen (NORCO) 5-325 MG per tablet Take 1 tablet by mouth every 6 (six) hours as needed for moderate pain. Patient not  taking: Reported on 06/22/2015 02/20/15   Eldred Manges, MD  lactobacillus acidophilus & bulgar (LACTINEX) chewable tablet Chew 1 tablet by mouth 3 (three) times daily with meals. Take for 10 days and then stop Patient not taking: Reported on 06/22/2015 02/22/15   Leana Roe Elgergawy, MD  ondansetron (ZOFRAN) 4 MG tablet Take 1 tablet (4 mg total) by mouth every 6 (six) hours as needed for nausea. Patient not taking: Reported on 06/22/2015 02/22/15   Leana Roe Elgergawy, MD  senna-docusate (SENOKOT-S) 8.6-50 MG per tablet Take 1 tablet by mouth at bedtime as needed for mild constipation. Patient not taking: Reported on 06/22/2015 02/22/15   Starleen Arms, MD     Physical Exam: Filed Vitals:   06/22/15 1917 06/22/15 1919 06/22/15 1930 06/22/15 1945  BP:   133/77 118/62  Pulse:      Temp:      TempSrc:  Resp:      Height:      Weight:      SpO2: 100% 100% 94% 100%     Constitutional: Vital signs reviewed. Patient is elderly male who is alert and oriented to person and in no acute distress Head: Normocephalic and atraumatic  Ear: TM normal bilaterally  Mouth: no erythema or exudates, MMM  Eyes: PERRL, EOMI, conjunctivae normal, No scleral icterus.  Neck: Supple, Trachea midline normal ROM, No JVD, mass, thyromegaly, or carotid bruit present.  Cardiovascular: Bradycardia with intermittent pauses Pulmonary/Chest: CTAB, no wheezes, rales, or rhonchi  Abdominal: Soft. Non-tender, non-distended, bowel sounds are normal, no masses, organomegaly, or guarding present.  GU: no CVA tenderness Musculoskeletal: No joint deformities, erythema, or stiffness, ROM full and no nontender Ext: no edema and no cyanosis, pulses palpable bilaterally (DP and PT)  Hematology: no cervical, inginal, or axillary adenopathy.  Neurological: A&O x3, Strenght is normal and symmetric bilaterally, cranial nerve II-XII are grossly intact, no focal motor deficit, sensory intact to light touch bilaterally.  Skin: Warm, dry  and intact. No rash, cyanosis, or clubbing.  Psychiatric: Normal mood and affect. speech and behavior is normal. Judgment and thought content normal. Cognition and memory are poor     Data Review   Micro Results No results found for this or any previous visit (from the past 240 hour(s)).  Radiology Reports Dg Chest Port 1 View  06/22/2015  CLINICAL DATA:  Cough.  Dementia. EXAM: PORTABLE CHEST 1 VIEW COMPARISON:  02/19/2015 FINDINGS: No cardiomegaly.  Stable normal aortic contour.  Negative hila. Chronic mild interstitial coarsening without focal pneumonia or edema. No effusion or pneumothorax. IMPRESSION: Stable.  No evidence of acute cardiopulmonary disease. Electronically Signed   By: Marnee Spring M.D.   On: 06/22/2015 19:00     CBC  Recent Labs Lab 06/22/15 1735  WBC 7.8  HGB 12.7*  HCT 39.1  PLT 164  MCV 88.7  MCH 28.8  MCHC 32.5  RDW 13.7    Chemistries   Recent Labs Lab 06/22/15 1735 06/22/15 1829  NA 137  --   K 4.6  --   CL 102  --   CO2 27  --   GLUCOSE 112*  --   BUN 19  --   CREATININE 1.30*  --   CALCIUM 9.1  --   MG  --  1.9   ------------------------------------------------------------------------------------------------------------------ estimated creatinine clearance is 59.4 mL/min (by C-G formula based on Cr of 1.3). ------------------------------------------------------------------------------------------------------------------ No results for input(s): HGBA1C in the last 72 hours. ------------------------------------------------------------------------------------------------------------------ No results for input(s): CHOL, HDL, LDLCALC, TRIG, CHOLHDL, LDLDIRECT in the last 72 hours. ------------------------------------------------------------------------------------------------------------------ No results for input(s): TSH, T4TOTAL, T3FREE, THYROIDAB in the last 72 hours.  Invalid input(s):  FREET3 ------------------------------------------------------------------------------------------------------------------ No results for input(s): VITAMINB12, FOLATE, FERRITIN, TIBC, IRON, RETICCTPCT in the last 72 hours.  Coagulation profile No results for input(s): INR, PROTIME in the last 168 hours.  No results for input(s): DDIMER in the last 72 hours.  Cardiac Enzymes No results for input(s): CKMB, TROPONINI, MYOGLOBIN in the last 168 hours.  Invalid input(s): CK ------------------------------------------------------------------------------------------------------------------ Invalid input(s): POCBNP   CBG: No results for input(s): GLUCAP in the last 168 hours.     EKG: Independently reviewed. Sinus rhythm with a sinus pause and a new left bundle branch block   Assessment/Plan  Bradycardia with New left bundle branch block (LBBB): Acute. Findings on EKG previously not present. Previous EKG showed a right bundle branch block. Telemetry while patient in  the room heart rates as low as 37 per monitor. Daughter who is power of attorney for patient wants to be fully informed regarding overall risks/benefits, but is not sure that she would want a pacemaker placed at this time. Last echocardiogram on 02/19/2015 showed EF of 60-65%. -Admit to telemetry bed -Consult EP in a.m.   Chronic kidney disease stage III: Patient with a creatinine of 1.3 on admission. Baseline creatinine ranges 1.01-1.52  it appears looking at previous admissions. -Gentle IV fluids of normal saline 75 mL per hour -Recheck BMP in a.m.  Possible syncopal events: Will likely be related to patient's low heart rates. -Physical therapy to work with patient in a.m. -Orthostatic vital signs in a.m.  Fatigue: Likely secondary to above with decreased heart rate as well as patient also seen have mild signs of dehydration. Patient appears otherwise pleasant and happy no overt signs of depression. -IV fluids as seen  above  Suspected Syncope   BPH -Continue Flomax  Depression -Continue Celexa    Urinary incontinence: Patient with no signs of infection on UA. Note patient does have a history of BPH and is on Flomax. -Check bladder scan for Urinary Retention. Place Foley if needed -Initially continuingMirabegron, but  will need to discontinue if found to have urinary retention  HLD -Continue pravastatin    Hypertension: Stable -Continue to monitor  Alzheimer's disease: Stable. Patient is pleasant and does not have any behavioral disturbances. -Continue Namenda  Hearing loss of both ears: Stable. Patient wears hearing aids  Code Status: DO NOT RESUSCITATE  Family Communication: bedside Disposition Plan: admit   Total time spent 55 minutes.Greater than 50% of this time was spent in counseling, explanation of diagnosis, planning of further management, and coordination of care  Clydie Braun [facility] Pager [phone]  If 7PM-7AM, please contact night-coverage [email or web address] Password [facility] 06/22/2015, 8:44 PM

## 2015-06-22 NOTE — ED Notes (Signed)
EDP at bedside  

## 2015-06-23 ENCOUNTER — Observation Stay (HOSPITAL_COMMUNITY): Payer: Commercial Managed Care - HMO

## 2015-06-23 ENCOUNTER — Encounter (HOSPITAL_COMMUNITY): Payer: Self-pay | Admitting: General Practice

## 2015-06-23 DIAGNOSIS — R5382 Chronic fatigue, unspecified: Secondary | ICD-10-CM | POA: Diagnosis not present

## 2015-06-23 DIAGNOSIS — N183 Chronic kidney disease, stage 3 unspecified: Secondary | ICD-10-CM

## 2015-06-23 DIAGNOSIS — I441 Atrioventricular block, second degree: Secondary | ICD-10-CM | POA: Diagnosis not present

## 2015-06-23 DIAGNOSIS — F0280 Dementia in other diseases classified elsewhere without behavioral disturbance: Secondary | ICD-10-CM | POA: Diagnosis not present

## 2015-06-23 DIAGNOSIS — I951 Orthostatic hypotension: Secondary | ICD-10-CM | POA: Insufficient documentation

## 2015-06-23 DIAGNOSIS — E785 Hyperlipidemia, unspecified: Secondary | ICD-10-CM | POA: Diagnosis present

## 2015-06-23 DIAGNOSIS — R5383 Other fatigue: Secondary | ICD-10-CM

## 2015-06-23 DIAGNOSIS — R001 Bradycardia, unspecified: Secondary | ICD-10-CM | POA: Diagnosis not present

## 2015-06-23 DIAGNOSIS — G309 Alzheimer's disease, unspecified: Secondary | ICD-10-CM | POA: Diagnosis not present

## 2015-06-23 DIAGNOSIS — I447 Left bundle-branch block, unspecified: Secondary | ICD-10-CM | POA: Diagnosis present

## 2015-06-23 LAB — URINE MICROSCOPIC-ADD ON

## 2015-06-23 LAB — URINALYSIS, ROUTINE W REFLEX MICROSCOPIC
Bilirubin Urine: NEGATIVE
GLUCOSE, UA: NEGATIVE mg/dL
Hgb urine dipstick: NEGATIVE
Ketones, ur: NEGATIVE mg/dL
Nitrite: NEGATIVE
PROTEIN: NEGATIVE mg/dL
Specific Gravity, Urine: 1.023 (ref 1.005–1.030)
pH: 5.5 (ref 5.0–8.0)

## 2015-06-23 LAB — VITAMIN B12: Vitamin B-12: 496 pg/mL (ref 180–914)

## 2015-06-23 LAB — BASIC METABOLIC PANEL
ANION GAP: 7 (ref 5–15)
BUN: 16 mg/dL (ref 6–20)
CALCIUM: 8.7 mg/dL — AB (ref 8.9–10.3)
CO2: 28 mmol/L (ref 22–32)
Chloride: 105 mmol/L (ref 101–111)
Creatinine, Ser: 1.14 mg/dL (ref 0.61–1.24)
GFR, EST NON AFRICAN AMERICAN: 59 mL/min — AB (ref >60)
Glucose, Bld: 86 mg/dL (ref 65–99)
Potassium: 3.8 mmol/L (ref 3.5–5.1)
SODIUM: 140 mmol/L (ref 135–145)

## 2015-06-23 LAB — CORTISOL-AM, BLOOD: Cortisol - AM: 13.6 ug/dL (ref 6.7–22.6)

## 2015-06-23 LAB — TSH: TSH: 1.726 u[IU]/mL (ref 0.350–4.500)

## 2015-06-23 MED ORDER — CITALOPRAM HYDROBROMIDE 20 MG PO TABS
20.0000 mg | ORAL_TABLET | Freq: Every day | ORAL | Status: DC
  Administered 2015-06-24 – 2015-06-25 (×2): 20 mg via ORAL
  Filled 2015-06-23 (×2): qty 1

## 2015-06-23 MED ORDER — BRIMONIDINE TARTRATE 0.2 % OP SOLN
1.0000 [drp] | Freq: Three times a day (TID) | OPHTHALMIC | Status: DC
  Administered 2015-06-23 – 2015-06-25 (×5): 1 [drp] via OPHTHALMIC
  Filled 2015-06-23: qty 5

## 2015-06-23 NOTE — Evaluation (Signed)
Physical Therapy Evaluation Patient Details Name: Willie Mayer. MRN: [id number] DOB: 04/15/1933 Today's Date: 06/23/2015   History of Present Illness  Patient is a 81 year old male with a past medical history of dementia, CVA, BPH, depression; who presents for symptoms of fatigue and possible syncope. Patient is a resident of a nursing facility and his daughter provides his history as he has dementia and is unable to do so for himself. States that approximately 6 days ago on he had a fall in the bathroom and had hit his right elbow. Patient was noted to be alert following the event with no known losses consciousness. Pt with bradycardia.  Clinical Impression  Pt tolerated OOB mobility well. Daughter present and reports "he hasn't walked this far in I don't know how long." Pt with dementia but transfering and ambulating with min/modA. Pt with 24/7 care available. Once medically stable feel pt is safe to return to indep living with con't 24/7 assist.    Follow Up Recommendations Home health PT;Supervision/Assistance - 24 hour    Equipment Recommendations  None recommended by PT    Recommendations for Other Services       Precautions / Restrictions Precautions Precautions: Fall Precaution Comments: dementia Restrictions Weight Bearing Restrictions: No      Mobility  Bed Mobility Overal bed mobility: Needs Assistance Bed Mobility: Supine to Sit     Supine to sit: Min assist;HOB elevated     General bed mobility comments: max encouragment. pt brought LEs off EOB, assist for trunk elevation  Transfers Overall transfer level: Needs assistance Equipment used: Rolling walker (2 wheeled) Transfers: Sit to/from Stand Sit to Stand: Mod assist         General transfer comment: v/c's for safe hand placement, assist to initiate transfer/anterior weight shift  Ambulation/Gait Ambulation/Gait assistance: Mod assist;+2 safety/equipment (chair follow/push iv pole) Ambulation  Distance (Feet): 50 Feet Assistive device: Rolling walker (2 wheeled) Gait Pattern/deviations: Step-to pattern;Decreased stride length;Shuffle Gait velocity: decreased   General Gait Details: pt with short shuffled steps, modA for walker management/keep walker close, v/c's to achieve full upright posture and clear feet to prevent shuffling  Stairs            Wheelchair Mobility    Modified Rankin (Stroke Patients Only)       Balance Overall balance assessment: Needs assistance Sitting-balance support: Feet supported;No upper extremity supported Sitting balance-Leahy Scale: Fair     Standing balance support: Bilateral upper extremity supported Standing balance-Leahy Scale: Poor Standing balance comment: needs UE support                             Pertinent Vitals/Pain Pain Assessment: No/denies pain    Home Living Family/patient expects to be discharged to::  (independent living - [place])                 Additional Comments: pt lives with spouse who has parkinson's and dementia. there is hired 24/7 care. PLOF provided by daughter due to pt with dementia as well    Prior Function Level of Independence: Needs assistance   Gait / Transfers Assistance Needed: pt was walking with RW in apartment however over the last 2 weeks the patient has been refusing to get OOB due to fatigue  ADL's / Homemaking Assistance Needed: assist with all ADLs from caregivers        Hand Dominance   Dominant Hand: Right    Extremity/Trunk Assessment  Upper Extremity Assessment: Generalized weakness           Lower Extremity Assessment: Generalized weakness      Cervical / Trunk Assessment: Normal  Communication   Communication: No difficulties  Cognition Arousal/Alertness: Awake/alert Behavior During Therapy: WFL for tasks assessed/performed Overall Cognitive Status: History of cognitive impairments - at baseline (pt with h/o dementia)                       General Comments General comments (skin integrity, edema, etc.): Spoke extensively with pt's daughter regarding her concerns with have the pacemaker placed. Advised her to discuss with MD. Pt tearful regarding making the decisions.    Exercises        Assessment/Plan    PT Assessment    PT Diagnosis Difficulty walking;Generalized weakness   PT Problem List    PT Treatment Interventions     PT Goals (Current goals can be found in the Care Plan section) Acute Rehab PT Goals Patient Stated Goal: didn't state PT Goal Formulation: With patient Time For Goal Achievement: 07/07/15 Potential to Achieve Goals: Good    Frequency     Barriers to discharge        Co-evaluation               End of Session Equipment Utilized During Treatment: Gait belt Activity Tolerance: Patient tolerated treatment well Patient left: in chair;with call bell/phone within reach;with chair alarm set;with family/visitor present Nurse Communication: Mobility status    Functional Assessment Tool Used: clinical judgment Functional Limitation: Mobility: Walking and moving around Mobility: Walking and Moving Around Current Status [phone]): At least 20 percent but less than 40 percent impaired, limited or restricted Mobility: Walking and Moving Around Goal Status [phone]): At least 1 percent but less than 20 percent impaired, limited or restricted    Time: 0301-3143 PT Time Calculation (min) (ACUTE ONLY): 25 min   Charges:   PT Evaluation $Initial PT Evaluation Tier I: 1 Procedure PT Treatments $Gait Training: 8-22 mins   PT G Codes:   PT G-Codes **NOT FOR INPATIENT CLASS** Functional Assessment Tool Used: clinical judgment Functional Limitation: Mobility: Walking and moving around Mobility: Walking and Moving Around Current Status ([id number]): At least 20 percent but less than 40 percent impaired, limited or restricted Mobility: Walking and Moving Around Goal Status [phone]):  At least 1 percent but less than 20 percent impaired, limited or restricted    Marcene Brawn 06/23/2015, 11:06 AM   Lewis Shock, PT, DPT Pager #: [phone] Office #: [phone]

## 2015-06-23 NOTE — Consult Note (Signed)
NEURO HOSPITALIST CONSULT NOTE   Requestig physician: Dr. Sunnie Nielsen Dr Graciela Husbands   Reason for Consult:Altered Mental Status  HPI:                                                                                                                                          Willie Fleig. is an 81 y.o. male is a 81 yo male with PMH of dementia (presumed Alzheimer's), CVA, BPH, Depression, PE, and bilateral hearing loss who presented to the emergency department from assisted living yesterday due to fatigue and a possible syncopal episode.  He was found to be bradycardic, orthostatic, and with a new LBBB, electrophysiology was consulted and is currently considering him for possible PPM placement however was concerned that his cardiac disease dose not completely explain his presenting symptoms, and AMS that he has had while in[place]inet. His daughter Willie Mayer, is present and provides most of his history.  She reports that he first started having minor trouble with his memory a few years ago.  Over 2 years ago she moved her patents down to [place] from [place] after he was diagnosed with dementia as her mother also has Parkinson's with dementia.  They have lived in an ALF with 24 hour caregiver support.  She reports his dementia is mostly confined to short term memory loss but that he is usually oriented.  He does not drive, cook, or control his finances but is able to eat without assistance, usually dress, and occasionally bath without assistance.  He has been also been dealing with some generalized malaise and fatigue over the past few years, he sleeps usually until the afternoon (16-18 hours a day) and has had some decreased appetite.  He has been undergoing treatment for depression as a possible cause, but otherwise outpatient workup for his fatigue has been negative.  He does see Dr Everlena Cooper for his dementia who feels it is a neurodegenerative process and he has been on namenda for this.  He was  not able to tolerate aricept due to aggressive behavior.  Additionally he has a history of BPH, daughter reports that he has had overflow incontinent for >1 year and required diapers.  However over the past month this has significantly progressed to where he seems to be unable to control his bladder function at all.  He is not incontient of stool. He denies any numbness or changes in sensation.  He does report that he has weakness especially in his legs but that it appears to be improving.  He denies any hallucinations (although his daughter is concerned she witnessed him having a conversation with someone not present).  Past Medical History  Diagnosis Date  . Dementia   . Hypertension   . History of CVA (cerebrovascular accident)   .  History of DVT (deep vein thrombosis)   . History of pulmonary embolism   . Depression   . Glaucoma   . Urge incontinence   . BPH (benign prostatic hyperplasia)     s/p surgery that "didnt work"  . Stroke (HCC)   . Shortness of breath dyspnea   . New left bundle branch block (LBBB) 06/2015    Past Surgical History  Procedure Laterality Date  . Replacement total knee bilateral    . Bph surgery    . Joint replacement    . Intramedullary (im) nail intertrochanteric Left 02/20/2015    Procedure: LEFT  INTERTROCHANTRIC HIP FRACTURE;  Surgeon: Eldred Manges, MD;  Location: [facility];  Service: Orthopedics;  Laterality: Left;    Family History  Problem Relation Age of Onset  . Alcoholism Father     died in 46s related to complciations  . Heart failure Mother   . Arrhythmia Mother     Family History: Mother Heart failure Father Alcoholism  Social History:  reports that he quit smoking about 49 years ago. He has never used smokeless tobacco. He reports that he drinks alcohol. He reports that he does not use illicit drugs.  No Known Allergies  MEDICATIONS:                                                                                                                      I have reviewed the patient's current medications.   ROS:                                                                                                                                       History obtained from child, chart review and the patient  General ROS: positive for fatigue negative for - chills, , fever, night sweats, weight gain or weight loss Psychological ROS: negative for - behavioral disorder, hallucinations,  mood swings or suicidal ideation Ophthalmic ROS: negative for - blurry vision, double vision, eye pain or loss of vision ENT ROS: negative for - epistaxis, nasal discharge, oral lesions, sore throat, tinnitus or vertigo Allergy and Immunology ROS: positive for itchy/watery eyes negative for - hives Hematological and Lymphatic ROS: negative for - bleeding problems, bruising or swollen lymph nodes Endocrine ROS: negative for - galactorrhea, hair pattern changes, polydipsia/polyuria or temperature intolerance Respiratory ROS: positive for cough, negative for -  hemoptysis, shortness of breath or wheezing Cardiovascular  ROS: negative for - chest pain, dyspnea on exertion, edema or irregular heartbeat Gastrointestinal ROS: negative for - abdominal pain, diarrhea, hematemesis, nausea/vomiting or stool incontinence Genito-Urinary ROS: negative for - dysuria, hematuria Musculoskeletal ROS: negative for - joint swelling or muscular weakness Neurological ROS: as noted in HPI Dermatological ROS: negative for rash and skin lesion changes   Blood pressure 111/79, pulse 67, temperature 98 F (36.7 C), temperature source Oral, resp. rate 20, height  (1.88 m), weight 233 lb (105.688 kg), SpO2 98 %.   Neurologic Examination:                                                                                                      HEENT-  Normocephalic, no lesions, without obvious abnormality.  Normal external eye and conjunctiva.  Normal  external ears. Normal external  nose, mucus membranes and septum.  Normal pharynx. Cardiovascular- regular rate and rhythm, S1, S2 normal, no murmur, click, rub or gallop, pulses palpable throughout   Lungs- chest clear, no wheezing, rales, normal symmetric air entry, Heart exam - S1, S2 normal, no murmur, no gallop, rate regular Abdomen- soft, non-tender; bowel sounds normal; no masses,  no organomegaly Extremities- less then 2 second capillary refill Lymph-no adenopathy palpable Musculoskeletal-no joint tenderness, deformity or swelling Skin-warm and dry, no hyperpigmentation, vitiligo, or suspicious lesions  Neurological Examination Mental Status: Alert, oriented to person, place ([facility]/ hospital), and time (Dec 2016), thought content appropriate.  Speech fluent without evidence of aphasia.  Able to follow 3 step commands without difficulty. Cranial Nerves: II: Visual fields grossly normal, pupils equal, round, reactive to light and accommodation III,IV, VI: ptosis not present, extra-ocular motions intact bilaterally V,VII: smile symmetric, facial light touch sensation normal bilaterally VIII: hearing normal bilaterally IX,X: uvula rises symmetrically XI: bilateral shoulder shrug XII: midline tongue extension Motor: Right : Upper extremity   5/5    Left:     Upper extremity   5/5  Lower extremity   5/5     Lower extremity   5/5 Tone and bulk:normal tone throughout; no atrophy noted Sensory: light touch intact throughout, bilaterally Deep Tendon Reflexes: 2+ and symmetric throughout Plantars: Right: downgoing   Left: downgoing Cerebellar: normal finger-to-nose, romberg negative Gait: not assessed      Lab Results: Basic Metabolic Panel:  Recent Labs Lab 06/22/15 1735 06/22/15 1829 06/22/15 2154 06/23/15 0354  NA 137  --   --  140  K 4.6  --   --  3.8  CL 102  --   --  105  CO2 27  --   --  28  GLUCOSE 112*  --   --  86  BUN 19  --   --  16  CREATININE 1.30*  --   --  1.14  CALCIUM 9.1  --   --   8.7*  MG  --  1.9  --   --   PHOS  --   --  3.8  --     Liver Function Tests: No results for input(s): AST, ALT, ALKPHOS, BILITOT, PROT, ALBUMIN  in the last 168 hours. No results for input(s): LIPASE, AMYLASE in the last 168 hours. No results for input(s): AMMONIA in the last 168 hours.  CBC:  Recent Labs Lab 06/22/15 1735  WBC 7.8  HGB 12.7*  HCT 39.1  MCV 88.7  PLT 164    Cardiac Enzymes: No results for input(s): CKTOTAL, CKMB, CKMBINDEX, TROPONINI in the last 168 hours.  Lipid Panel: No results for input(s): CHOL, TRIG, HDL, CHOLHDL, VLDL, LDLCALC in the last 168 hours.  CBG: No results for input(s): GLUCAP in the last 168 hours.  Microbiology: No results found for this or any previous visit.  Coagulation Studies: No results for input(s): LABPROT, INR in the last 72 hours.  MISC Labs Results for ESCHOL, MELEAR (MRN [id number]) as of 06/23/2015 16:38  Ref. Range 06/23/2015 12:00  Vitamin B-12 Latest Ref Range: 180-914 pg/mL 496  Cortisol - AM Latest Ref Range: 6.7-22.6 ug/dL 76.8  TSH Latest Ref Range: 0.350-4.500 uIU/mL 1.726   Imaging: Dg Chest Port 1 View  06/22/2015  CLINICAL DATA:  Cough.  Dementia. EXAM: PORTABLE CHEST 1 VIEW COMPARISON:  02/19/2015 FINDINGS: No cardiomegaly.  Stable normal aortic contour.  Negative hila. Chronic mild interstitial coarsening without focal pneumonia or edema. No effusion or pneumothorax. IMPRESSION: Stable.  No evidence of acute cardiopulmonary disease. Electronically Signed   By: Marnee Spring M.D.   On: 06/22/2015 19:00    Assessment/Plan: 81 year old male with history of dementia and chronic progressive fatigue, presenting with bradycardia, orthostasis, likely with some acute delirium in hospital.  Dementia with acute delirium in hospitalized patient - He has previously been diagnosed with Alzheimers dementia.  He apparently has had previous imaging on his brain (per Neurology report in 2014 he had an MRI of brain in  2012 which revealed stroke a few years prior) I do not see an MRI report since his diagnosis of dementia.  He does have symptoms of urinary incontinence but likely explained by his BPH.   -Obtain MRI brain, can be done outpatient  Chronic fatigue - His workup thus far has revealed normal TSH, B12, CBC, Renal function. - Consider trial of changing SSRI  Bradycardia - Daughter considering PPM  Willie Rung, DO [facility] PGY-3 Pager: [phone]  06/23/2015, 4:17 PM    I have seen the patient and agree with the above note. He is pleasant but discusses events of the past. He is able to tell me the month, year, and # of quarters in $2.75.   Patient with progressive lethargy, dementia, but also with urinary incontinence and gait dysfunction. I suspect the gait dysfunction is multifactorial. Lethargy can be a difficult symptom to treat. If no medical cause can be found, could try low dose provigil or changing SSRI but would need to be wary of behavioral side effects as well.   Given his symptoms, I would like to get some brain imaging since this has not been done since 2012.    Willie Slot, MD [facility] [phone]  If 7pm- 7am, please page neurology on call as listed in AMION.

## 2015-06-23 NOTE — Consult Note (Signed)
ELECTROPHYSIOLOGY CONSULT NOTE    Patient ID: Willie Mayer. MRN: [id number], DOB/AGE: 23-Dec-1932 81 y.o.  Admit date: 06/22/2015 Date of Consult: 06/23/2015  Primary Physician: Gaye Alken, MD Primary Cardiologist: Dr. [place]  Reason for Consultation: bradycardia  HPI: Willie Ropp. is a 81 y.o. male with PMHx of dimetia, CVA, depression, HTN, glaucoma, who is a resident of a ALF who by records had a fall about a week ago with unclear specifics, but reportedly not associated with LOC, but some confused speech.  He was observed to be more lethargic then ususal in the last week particularly.  He was diagnosed with Alzheimer's dementia 2 years ago and his daughter provides HPI and PMHx with baseline dementia.  She reports that last week he was not felt to have had LOC, that her mother heard a thump and found him on one knee and had hit his elbow on the sink in the BR, he was awake but seemed to have garbled or confused speech that resolved, he was helped to bed and the symptom resolved.  He has been more tired since then, and she states that this weekend a caregiver called her that he would not respond to her, she assumed he was being stubborn and nothing more was done, though eventually called again and asked to come because he was unable to be woken, when she arrived despite efforts they could not arouse him, he was not pale or diaphoretic, he was breathing regularly and normally and then suddenly woke and recognized everyone in the room, and was at his baseline. FR was called and they reported his VS and EKG were OK and not transported.  Since then though he seemed even more so fatigued and somewhat lethargic and brought to the ER  He has history of orthostatic hypotension with prior episodes of near syncope and syncope with adjustment in his medicines a couple years ago to some degree felt secondary to Rapaflo and Namenda.  (on Namenda, Flomax now) with mild orthostatic BP here  as well, supine this AM 110/83 (HR 65), sitting 96/54 (HR41), standing 98/61 (HR 144).  He has been observed to have bradycardic rates ito the 30's with a new LBBB in the ER and admitted for further evaluation.  Out patient he is on no nodal blocking agents outside of his Combigan eye gtts.  On telemetry he has been observed to have SR, 1st degree AVblock, with 2:1 AVblock with HR 30's transiently and episodes of 2nd degree AVBlock type I  (no reported symptoms has been in bed since here)  The patient's daughter is POA, the patient is a DNR status and she states she is inclined to pursue a conservative approach to her father's care and that if the course of his dementia is not going to be altered or improved probably not inclined to want to have PPM implanted. She reports he spends most of his days in bed, and fears him dying with advanced dementia symptoms and problems.  Past Medical History  Diagnosis Date  . Dementia   . Hypertension   . History of CVA (cerebrovascular accident)   . History of DVT (deep vein thrombosis)   . History of pulmonary embolism   . Depression   . Glaucoma   . Urge incontinence   . BPH (benign prostatic hyperplasia)     s/p surgery that "didnt work"  . Stroke (HCC)   . Shortness of breath dyspnea   . New left bundle branch  block (LBBB) 06/2015     Surgical History:  Past Surgical History  Procedure Laterality Date  . Replacement total knee bilateral    . Bph surgery    . Joint replacement    . Intramedullary (im) nail intertrochanteric Left 02/20/2015    Procedure: LEFT  INTERTROCHANTRIC HIP FRACTURE;  Surgeon: Eldred Manges, MD;  Location: [facility];  Service: Orthopedics;  Laterality: Left;     Prescriptions prior to admission  Medication Sig Dispense Refill Last Dose  . brimonidine-timolol (COMBIGAN) 0.2-0.5 % ophthalmic solution Place 1 drop into both eyes every 12 (twelve) hours.   06/22/2015 at Unknown time  . brinzolamide (AZOPT) 1 % ophthalmic  suspension Place 1 drop into the right eye 2 (two) times daily.   06/22/2015 at Unknown time  . citalopram (CELEXA) 40 MG tablet Take 40 mg by mouth daily.   06/22/2015 at Unknown time  . clopidogrel (PLAVIX) 75 MG tablet Take 1 tablet (75 mg total) by mouth daily. (Patient taking differently: Take 75 mg by mouth at bedtime. ) 90 tablet 3 06/21/2015 at Unknown time  . memantine (NAMENDA) 10 MG tablet Take 1 tablet (10 mg total) by mouth 2 (two) times daily. 60 tablet 5 06/22/2015 at Unknown time  . mirabegron ER (MYRBETRIQ) 50 MG TB24 tablet Take 50 mg by mouth daily.    06/22/2015 at Unknown time  . Multiple Vitamin (MULTIVITAMIN WITH MINERALS) TABS tablet Take 1 tablet by mouth at bedtime.    06/21/2015 at Unknown time  . pravastatin (PRAVACHOL) 20 MG tablet TAKE ONE TABLET BY MOUTH ONCE DAILY (Patient taking differently: Take one tablet by mouth once daily for cholesterol at bedtime) 90 tablet 0 06/22/2015 at Unknown time  . tamsulosin (FLOMAX) 0.4 MG CAPS capsule Take 0.4 mg by mouth daily after breakfast. Take one tablet by mouth once daily for prostate   06/22/2015 at Unknown time  . Vitamin D, Ergocalciferol, (DRISDOL) 50000 UNITS CAPS capsule Take 50,000 Units by mouth daily.   06/22/2015 at Unknown time  . bisacodyl (DULCOLAX) 5 MG EC tablet Take 1 tablet (5 mg total) by mouth daily as needed for moderate constipation. (Patient not taking: Reported on 06/22/2015) 30 tablet 0 Not Taking at Unknown time  . enoxaparin (LOVENOX) 30 MG/0.3ML injection Inject 0.3 mLs (30 mg total) into the skin daily. Please take for DVT prophylaxis for 3 weeks then stop 0 Syringe  Taking  . HYDROcodone-acetaminophen (NORCO) 5-325 MG per tablet Take 1 tablet by mouth every 6 (six) hours as needed for moderate pain. (Patient not taking: Reported on 06/22/2015) 30 tablet 0 Not Taking at Unknown time  . lactobacillus acidophilus & bulgar (LACTINEX) chewable tablet Chew 1 tablet by mouth 3 (three) times daily with meals. Take for 10  days and then stop (Patient not taking: Reported on 06/22/2015)   Completed Course at Unknown time  . ondansetron (ZOFRAN) 4 MG tablet Take 1 tablet (4 mg total) by mouth every 6 (six) hours as needed for nausea. (Patient not taking: Reported on 06/22/2015) 20 tablet 0 Not Taking at Unknown time  . senna-docusate (SENOKOT-S) 8.6-50 MG per tablet Take 1 tablet by mouth at bedtime as needed for mild constipation. (Patient not taking: Reported on 06/22/2015)   Not Taking at Unknown time    Inpatient Medications:  . brimonidine  1 drop Both Eyes Q12H   And  . timolol  1 drop Both Eyes BID  . brinzolamide  1 drop Right Eye BID  . citalopram  40 mg Oral Daily  . clopidogrel  75 mg Oral QHS  . enoxaparin (LOVENOX) injection  40 mg Subcutaneous Q24H  . memantine  10 mg Oral BID  . mirabegron ER  50 mg Oral Daily  . multivitamin with minerals  1 tablet Oral QHS  . pravastatin  20 mg Oral Daily  . sodium chloride  3 mL Intravenous Q12H  . tamsulosin  0.4 mg Oral QPC breakfast  . Vitamin D (Ergocalciferol)  50,000 Units Oral Daily    Allergies: No Known Allergies  Social History   Social History  . Marital Status: Married    Spouse Name: N/A  . Number of Children: N/A  . Years of Education: N/A   Occupational History  . Insurance     Social History Main Topics  . Smoking status: Former Smoker -- 15.00 packs/day    Quit date: 07/17/1965  . Smokeless tobacco: Never Used  . Alcohol Use: Yes     Comment: occasional gin and tonic  . Drug Use: No  . Sexual Activity: No   Other Topics Concern  . Not on file   Social History Narrative   Recently moved from [place] where he lived with his wife who has parkinsonian dementia. They were independent but moved down here after recent fall and knowledge of dementia. Live in independent living facility and indendent in [place].       Currently living at [facility] living. Lives with his wife. They help with medication delivery and activities  of daily life.      Family History  Problem Relation Age of Onset  . Alcoholism Father     died in 4s related to complciations  . Heart failure Mother   . Arrhythmia Mother      Review of Systems: All other systems reviewed and are otherwise negative except as noted above.  Physical Exam: Filed Vitals:   06/22/15 2109 06/23/15 0008 06/23/15 0448 06/23/15 0451  BP: 126/55 134/72 115/61   Pulse: 52 58 57   Temp: 97.7 F (36.5 C)  97.9 F (36.6 C)   TempSrc: Oral  Oral   Resp: 18 20 20    Height:      Weight: 232 lb 8 oz (105.461 kg)   233 lb (105.688 kg)  SpO2: 99% 98%      GEN- The patient is obese, well appearing, alert and oriented to self, knows he is in the hospital for a slow heart beat  HEENT: normocephalic, atraumatic; sclera clear, conjunctiva pink; hearing intact; oropharynx clear; neck supple Lungs- Clear to ausculation bilaterally, normal work of breathing.  No wheezes, rales, rhonchi Heart- Regular rate and rhythm, no murmurs, rubs or gallops GI- soft, non-tender, non-distended Extremities- no clubbing, cyanosis, or edema MS- no significant deformity or atrophy Skin- warm and dry, no rash or lesion Psych- euthymic mood, full affect Neuro- no gross motor deficits observed.  The patient has known dementia, though is conversational and follows all commands  Labs:   Lab Results  Component Value Date   WBC 7.8 06/22/2015   HGB 12.7* 06/22/2015   HCT 39.1 06/22/2015   MCV 88.7 06/22/2015   PLT 164 06/22/2015    Recent Labs Lab 06/23/15 0354  NA 140  K 3.8  CL 105  CO2 28  BUN 16  CREATININE 1.14  CALCIUM 8.7*  GLUCOSE 86      Radiology/Studies:  Dg Chest Port 1 View 06/22/2015  CLINICAL DATA:  Cough.  Dementia. EXAM: PORTABLE CHEST  1 VIEW COMPARISON:  02/19/2015 FINDINGS: No cardiomegaly.  Stable normal aortic contour.  Negative hila. Chronic mild interstitial coarsening without focal pneumonia or edema. No effusion or pneumothorax. IMPRESSION:  Stable.  No evidence of acute cardiopulmonary disease. Electronically Signed   By: Marnee Spring M.D.   On: 06/22/2015 19:00   02/19/15: Echocardiogram Impressions: - Technically difficult study with poor acoustic windows. Normal size with mild LV hypertrophy. EF 60-65%. Mildly dilated RV with normal systolic function. No significant valvular abnormalities  EKG: SR, 1st degree AVblock, LBBB, one nonconducted p wave 02/19/15: EKG was SR, RBBB, LAFB TELEMETRY: SR/SB 50's, intermittent heart block 2:1 conduction with V rate 30's, intermittent 2nd degree WB  Assessment and Plan:  1. bradycardia     Episode of fall, episode of unable to wake patient and progressive fatigue      Heart block with rates 30's, doubt is etiology of his the events the daughter describes at home     Daughter is undecided about PPM inclined to proceed conservatively pending further discussion  We will ask hospitalist and neurology to the case, continue to hold his namenda possibly contributes to his bradycardia, and ask hospitalist regarding his eye gtts/timolol if able to stop.  2. Alzheimers dementia 3. Known hx of orthostatic hypotension    Signed, Francis Dowse, PA-C 06/23/2015 8:48 AM  The patient was seen and examined and a lengthy discussion with his daughter.  1-orthostatic hypotension. We discussed nonpharmacological therapies including raising the head of the bed 6 inches, abdominal binder and the use of a urinal or bedside commode at night. We also discussed the role of isometric contraction prior to standing. 2-Mobitz 1 second degree heart block. It is not clear to me that this is symptomatic. There is no syncope. At this juncture I don't think there is any indication for pacing for heart block. 3-bradycardia secondary to #2 is also not clear to me whether this is a significant issue or not. His daughter has real concerns about the future related to his progressive dementia. In the absence of a clear  indication for pacing for symptom relief, she would like to observe this. I think that this is reasonable. 4-the episode of nonresponsiveness that prompted the hospitalization I don't think is cardiac. I've asked that neurology offer input.  We will be glad to see as needed. Thank you for consultation

## 2015-06-23 NOTE — Progress Notes (Addendum)
[facility] PROGRESS NOTE  Willie Mayer. KVQ:[id number] DOB: 04-18-1933 DOA: 06/22/2015 PCP: Gaye Alken, MD  Assessment/Plan: Bradycardia with New left bundle branch block (LBBB): Acute. Findings on EKG previously not present. Previous EKG showed a right bundle branch block.  Last echocardiogram on 02/19/2015 showed EF of 60-65%. -Admit to telemetry bed -Consulted EP . Awaiting evaluation.  -hold namenda.  -Discussed with nurse, hold timolol until evaluated by EP, resume if is ok with EP physician.  - Addendum;  Discussed with Dr Nile Riggs, [facility] to discontinue Timolol. Change Alphagan to TID, continue with Azopt. Patient will need to follow up with Dr Nile Riggs as soon as he is discharge to have eye pressure check.   Glaucoma:  Addendum;  Discussed with Dr Nile Riggs, [facility] to discontinue Timolol. Change Alphagan to TID, continue with Azopt. Patient will need to follow up with Dr Nile Riggs as soon as he is discharge to have eye pressure check.   Chronic kidney disease stage III: Patient with a creatinine of 1.3 on admission. Baseline creatinine ranges 1.01-1.52 it appears looking at previous admissions. -Gentle IV fluids of normal saline 75 mL per hour -stable  Possible syncopal events: Will likely be related to patient's low heart rates. -Orthostatic vital signs in a.m.  Fatigue: Likely secondary to above with decreased heart rate as well as patient also seen have mild signs of dehydration. Patient appears otherwise pleasant and happy no overt signs of depression. -IV fluids as seen above -check TSH, B 12.   Suspected Syncope   BPH -Continue Flomax  Depression -Continue Celexa   Urinary incontinence: Patient with no signs of infection on UA. Note patient does have a history of BPH and is on Flomax. -Check bladder scan for Urinary Retention. Place Foley if needed -Initially continuing Mirabegron, but will need to discontinue if found to have urinary  retention -follow urine culture.   HLD -Continue pravastatin   Hypertension: Stable -Continue to monitor  Alzheimer's disease: Stable. Patient is pleasant and does not have any behavioral disturbances. -Hold  Namenda to avoid bradycardia.   Hearing loss of both ears: Stable. Patient wears hearing aids  Code Status: DNR Family Communication: care discussed with daughter.  Disposition Plan: Remain inpatient for EP evaluation.    Consultants:  Cardiology, EP.   Procedures:  none  Antibiotics:  none  HPI/Subjective: Patient is alert, sitting in the chair.  Per daughter patient sleeps a lot at home, since 2 years ago , but worse over last weeks.  He is able to dress himself, feed himself, recognized immediate family. Has memory problems, always forget that his wife is sick and diable.   Objective: Filed Vitals:   06/23/15 0911 06/23/15 0913  BP:    Pulse: 128 144  Temp:    Resp:      Intake/Output Summary (Last 24 hours) at 06/23/15 1048 Last data filed at 06/23/15 0948  Gross per 24 hour  Intake    480 ml  Output    125 ml  Net    355 ml   Filed Weights   06/22/15 1721 06/22/15 2109 06/23/15 0451  Weight: 108.41 kg (239 lb) 105.461 kg (232 lb 8 oz) 105.688 kg (233 lb)    Exam:   General:  Alert in no distress  Cardiovascular: S 1, S 2 RRR  Respiratory: CTA  Abdomen: BS present, soft, nt  Musculoskeletal: no edema  Data Reviewed: Basic Metabolic Panel:  Recent Labs Lab 06/22/15 1735 06/22/15 1829 06/22/15 2154 06/23/15 0354  NA 137  --   --  140  K 4.6  --   --  3.8  CL 102  --   --  105  CO2 27  --   --  28  GLUCOSE 112*  --   --  86  BUN 19  --   --  16  CREATININE 1.30*  --   --  1.14  CALCIUM 9.1  --   --  8.7*  MG  --  1.9  --   --   PHOS  --   --  3.8  --    Liver Function Tests: No results for input(s): AST, ALT, ALKPHOS, BILITOT, PROT, ALBUMIN in the last 168 hours. No results for input(s): LIPASE, AMYLASE in the last  168 hours. No results for input(s): AMMONIA in the last 168 hours. CBC:  Recent Labs Lab 06/22/15 1735  WBC 7.8  HGB 12.7*  HCT 39.1  MCV 88.7  PLT 164   Cardiac Enzymes: No results for input(s): CKTOTAL, CKMB, CKMBINDEX, TROPONINI in the last 168 hours. BNP (last 3 results) No results for input(s): BNP in the last 8760 hours.  ProBNP (last 3 results) No results for input(s): PROBNP in the last 8760 hours.  CBG: No results for input(s): GLUCAP in the last 168 hours.  No results found for this or any previous visit (from the past 240 hour(s)).   Studies: Dg Chest Port 1 View  06/22/2015  CLINICAL DATA:  Cough.  Dementia. EXAM: PORTABLE CHEST 1 VIEW COMPARISON:  02/19/2015 FINDINGS: No cardiomegaly.  Stable normal aortic contour.  Negative hila. Chronic mild interstitial coarsening without focal pneumonia or edema. No effusion or pneumothorax. IMPRESSION: Stable.  No evidence of acute cardiopulmonary disease. Electronically Signed   By: Marnee Spring M.D.   On: 06/22/2015 19:00    Scheduled Meds: . brimonidine  1 drop Both Eyes Q12H   And  . timolol  1 drop Both Eyes BID  . brinzolamide  1 drop Right Eye BID  . citalopram  40 mg Oral Daily  . clopidogrel  75 mg Oral QHS  . enoxaparin (LOVENOX) injection  40 mg Subcutaneous Q24H  . memantine  10 mg Oral BID  . mirabegron ER  50 mg Oral Daily  . multivitamin with minerals  1 tablet Oral QHS  . pravastatin  20 mg Oral Daily  . sodium chloride  3 mL Intravenous Q12H  . tamsulosin  0.4 mg Oral QPC breakfast  . Vitamin D (Ergocalciferol)  50,000 Units Oral Daily   Continuous Infusions: . sodium chloride 75 mL/hr at 06/22/15 2045    Principal Problem:   Bradycardia Active Problems:   Hypertension   Depression   Syncope   Hearing loss of both ears   Urinary incontinence   Alzheimer's disease   New left bundle branch block (LBBB)   Hyperlipidemia   Chronic kidney disease, stage III (moderate)    Fatigue    Time spent: 35 minutes.     Hartley Barefoot A  [facility] Pager [phone]. If 7PM-7AM, please contact night-coverage at [email or web address], password [facility] 06/23/2015, 10:48 AM

## 2015-06-23 NOTE — Care Management Obs Status (Signed)
MEDICARE OBSERVATION STATUS NOTIFICATION   Patient Details  Name: Willie Mayer. MRN: [id number] Date of Birth: 02/27/1933   Medicare Observation Status Notification Given:   yes    Cherrie Distance, RN 06/23/2015, 5:57 PM

## 2015-06-23 NOTE — Progress Notes (Signed)
Pt. Admitted to the unit from ED via stretcher. Pt. Alert and stable. No s/s of distress noted. Pt. Denies pain.  Family at bedside. Pt. Oriented to room and placed on telemetry. CCMD notified of pts. Arrival to floor. RN notified by CCMD that pt. Having pauses and in heart block. Text page sent to on call for [facility], K. Schorr, NP. RN will continue to monitor pt. For changes in condition. Willie Mayer, Willie Mayer

## 2015-06-23 NOTE — Progress Notes (Signed)
Bladder scan completed. 87cc noted in bladder. RN will continue to monitor.

## 2015-06-24 DIAGNOSIS — R001 Bradycardia, unspecified: Secondary | ICD-10-CM | POA: Diagnosis not present

## 2015-06-24 DIAGNOSIS — R5382 Chronic fatigue, unspecified: Secondary | ICD-10-CM

## 2015-06-24 DIAGNOSIS — G309 Alzheimer's disease, unspecified: Secondary | ICD-10-CM | POA: Diagnosis not present

## 2015-06-24 DIAGNOSIS — F0280 Dementia in other diseases classified elsewhere without behavioral disturbance: Secondary | ICD-10-CM

## 2015-06-24 DIAGNOSIS — I951 Orthostatic hypotension: Secondary | ICD-10-CM | POA: Diagnosis not present

## 2015-06-24 LAB — URINE CULTURE

## 2015-06-24 MED ORDER — SODIUM CHLORIDE 0.9 % IV SOLN
INTRAVENOUS | Status: DC
  Administered 2015-06-24: 17:00:00 via INTRAVENOUS

## 2015-06-24 MED ORDER — SENNOSIDES-DOCUSATE SODIUM 8.6-50 MG PO TABS
2.0000 | ORAL_TABLET | Freq: Two times a day (BID) | ORAL | Status: DC
  Administered 2015-06-24 – 2015-06-25 (×2): 2 via ORAL
  Filled 2015-06-24 (×2): qty 2

## 2015-06-24 MED ORDER — POLYETHYLENE GLYCOL 3350 17 G PO PACK
17.0000 g | PACK | Freq: Every day | ORAL | Status: DC
  Administered 2015-06-24 – 2015-06-25 (×2): 17 g via ORAL
  Filled 2015-06-24 (×2): qty 1

## 2015-06-24 NOTE — Progress Notes (Signed)
[facility] PROGRESS NOTE  Willie Mayer. ONG:[id number] DOB: 03-25-1933 DOA: 06/22/2015 PCP: Gaye Alken, MD Brief history: Patient is a 81 year old male with a past medical history of dementia, CVA, BPH, depression;  presents for symptoms of fatigue and possible syncope. He was found to be bradycardic and cardiology consutled for EP eval and the need for PPM.  Because he had an episode of unresponsiveness, neurology consulted to see if its a neurological event. MRI of the brain done , which did not reveal any new stroke. He has chronic microvascular ischemia and an old lacunar infarct.   Assessment/Plan: Bradycardia with New left bundle branch block (LBBB): Acute. Findings on EKG previously not present. Previous EKG showed a right bundle branch block.  Last echocardiogram on 02/19/2015 showed EF of 60-65%. He was admitted to telemetry bed, Consulted EP, recommended for optimization of orthostatic hypotension, no indication for pacing at this time for the Mobitz 1 second degree heart block.      Glaucoma: Discussed with Dr Nile Riggs,. Change Alphagan to TID, continue with Azopt. Patient will need to follow up with Dr Nile Riggs as soon as he is discharge to have eye pressure check.     Chronic kidney disease stage III: Patient with a creatinine of 1.3 on admission. Baseline creatinine ranges 1.01-1.52 it appears looking at previous admissions.gentle hydration. And creatinine improved.    Possible syncopal events: Will likely be related to patient's low heart rates. Neurology consulted for further recommendations, ordered MRi of the brain, showed chronic microvascular ischemi and old lacunar infarct.  -Orthostatic vital signs today are positive, ordered IV fluids and compression stockings in addition to the maneuvers recommended by cardiology. Plan for atleast a liter of IV FLUIDS and recheck his orthostatics in am, and discharge tomorrow if orthostatics are negative.    Fatigue: Likely secondary to above with decreased heart rate as well as patient also seen have mild signs of dehydration. Patient appears otherwise pleasant and happy no overt signs of depression. TSH and vit b12 are within normal limits. Gentle hydration on admission.  Plan for PT eval. Currently no signs of infection.  Also possibly from celexa, which he was taking 40 mg daily , decreased the dose to 20 mg daily.      BPH -Continue Flomax  Depression -Continue Celexa at a lower dose.   Urinary incontinence: Patient with no signs of infection on UA. Note patient does have a history of BPH and is on Flomax. -Check bladder scan for Urinary Retention. Place Foley if needed -Initially continuing Mirabegron, but will need to discontinue if found to have urinary retention -follow urine culture.   HLD -Continue pravastatin   Hypertension: Stable -Continue to monitor  Alzheimer's disease: Stable. Patient is pleasant and does not have any behavioral disturbances. -Hold  Namenda to avoid bradycardia.   Hearing loss of both ears: Stable. Patient wears hearing aids  Code Status: DNR Family Communication: care discussed with daughter.  Disposition Plan: possible D/C tomorrow if orthostatics are negative.    Consultants:  Cardiology, EP.   Neurology.  Procedures:  none  Antibiotics:  none  HPI/Subjective: Reports he feels dizzy on standing up.   Objective: Filed Vitals:   06/23/15 2049 06/24/15 0513  BP: 137/72 132/73  Pulse: 71 66  Temp: 97.5 F (36.4 C) 97.5 F (36.4 C)  Resp: 20 18    Intake/Output Summary (Last 24 hours) at 06/24/15 1007 Last data filed at 06/24/15 0659  Gross per 24 hour  Intake 3407.5  ml  Output    800 ml  Net 2607.5 ml   Filed Weights   06/22/15 2109 06/23/15 0451 06/24/15 0513  Weight: 105.461 kg (232 lb 8 oz) 105.688 kg (233 lb) 103.42 kg (228 lb)    Exam:   General:  Alert in no distress  Cardiovascular: S 1, S 2  RRR  Respiratory: CTA  Abdomen: BS present, soft, nt  Musculoskeletal: no edema  Data Reviewed: Basic Metabolic Panel:  Recent Labs Lab 06/22/15 1735 06/22/15 1829 06/22/15 2154 06/23/15 0354  NA 137  --   --  140  K 4.6  --   --  3.8  CL 102  --   --  105  CO2 27  --   --  28  GLUCOSE 112*  --   --  86  BUN 19  --   --  16  CREATININE 1.30*  --   --  1.14  CALCIUM 9.1  --   --  8.7*  MG  --  1.9  --   --   PHOS  --   --  3.8  --    Liver Function Tests: No results for input(s): AST, ALT, ALKPHOS, BILITOT, PROT, ALBUMIN in the last 168 hours. No results for input(s): LIPASE, AMYLASE in the last 168 hours. No results for input(s): AMMONIA in the last 168 hours. CBC:  Recent Labs Lab 06/22/15 1735  WBC 7.8  HGB 12.7*  HCT 39.1  MCV 88.7  PLT 164   Cardiac Enzymes: No results for input(s): CKTOTAL, CKMB, CKMBINDEX, TROPONINI in the last 168 hours. BNP (last 3 results) No results for input(s): BNP in the last 8760 hours.  ProBNP (last 3 results) No results for input(s): PROBNP in the last 8760 hours.  CBG: No results for input(s): GLUCAP in the last 168 hours.  Recent Results (from the past 240 hour(s))  Urine culture     Status: None (Preliminary result)   Collection Time: 06/23/15  2:22 PM  Result Value Ref Range Status   Specimen Description URINE, RANDOM  Final   Special Requests NONE  Final   Culture TOO YOUNG TO READ  Final   Report Status PENDING  Incomplete     Studies: Mr Brain Wo Contrast  06/23/2015  CLINICAL DATA:  Fatigue and possible syncopal episode yesterday. Dementia. Altered mental status. EXAM: MRI HEAD WITHOUT CONTRAST TECHNIQUE: Multiplanar, multiecho pulse sequences of the brain and surrounding structures were obtained without intravenous contrast. COMPARISON:  None. FINDINGS: The diffusion-weighted images demonstrate no evidence for acute or subacute infarction. No acute hemorrhage or mass lesion is present. Extensive  periventricular and subcortical white matter changes are present bilaterally. There is a remote lacunar infarct at the level of the genu of the left internal capsule. White matter changes extend into the brainstem. The cerebellum is within normal limits. The internal auditory canals are within normal limits bilaterally. Flow is present in the major intracranial arteries. The globes and orbits are intact. Mild mucosal thickening is present in the maxillary sinuses bilaterally, left greater than right. The remaining paranasal sinuses and the mastoid air cells are clear. The skullbase is within normal limits. Midline structures are otherwise unremarkable. IMPRESSION: 1. No acute intracranial abnormality. 2. Advanced atrophy and diffuse white matter disease. This likely reflects the sequela of chronic microvascular ischemia. 3. Remote lacunar infarct in the genu of the left internal capsule. Electronically Signed   By: Marin Roberts M.D.   On: 06/23/2015 20:48  Dg Chest Port 1 View  06/22/2015  CLINICAL DATA:  Cough.  Dementia. EXAM: PORTABLE CHEST 1 VIEW COMPARISON:  02/19/2015 FINDINGS: No cardiomegaly.  Stable normal aortic contour.  Negative hila. Chronic mild interstitial coarsening without focal pneumonia or edema. No effusion or pneumothorax. IMPRESSION: Stable.  No evidence of acute cardiopulmonary disease. Electronically Signed   By: Marnee Spring M.D.   On: 06/22/2015 19:00    Scheduled Meds: . brimonidine  1 drop Both Eyes TID  . brinzolamide  1 drop Right Eye BID  . citalopram  20 mg Oral Daily  . clopidogrel  75 mg Oral QHS  . enoxaparin (LOVENOX) injection  40 mg Subcutaneous Q24H  . mirabegron ER  50 mg Oral Daily  . multivitamin with minerals  1 tablet Oral QHS  . pravastatin  20 mg Oral Daily  . sodium chloride  3 mL Intravenous Q12H  . tamsulosin  0.4 mg Oral QPC breakfast  . Vitamin D (Ergocalciferol)  50,000 Units Oral Daily   Continuous Infusions: . sodium chloride 75  mL/hr at 06/22/15 2045    Principal Problem:   Bradycardia Active Problems:   Hypertension   Depression   Syncope   Hearing loss of both ears   Urinary incontinence   Alzheimer's disease   New left bundle branch block (LBBB)   Hyperlipidemia   Chronic kidney disease, stage III (moderate)   Fatigue   Orthostatic hypotension   Second degree AV block, Mobitz type I    Time spent: 35 minutes.     [facility]  [facility] Pager [phone] . If 7PM-7AM, please contact night-coverage at [email or web address], password [email or web address] 06/24/2015, 10:07 AM

## 2015-06-24 NOTE — Care Management Note (Signed)
Case Management Note  Patient Details  Name: Heshy Aftab. MRN: [id number] Date of Birth: Jul 31, 1932  Subjective/Objective:    Spoke with daughter, Graciella Belton.  Her Mom and Dad (patient) live at home together.  Both have dementia and they have 24 hour caregivers in the house with them through [facility] [employer].  PT recommending HHPT.  Have used [facility] in past, therapist, Waynetta Sandy, and would like to have her back.  Will need face to face and PT orders placed.  Referral made to [facility].  Daughter states planning for discharge today.                 Action/Plan:   Expected Discharge Date:                  Expected Discharge Plan:  Home w Home Health Services  In-House Referral:     Discharge planning Services  CM Consult  Post Acute Care Choice:    Choice offered to:     DME Arranged:    DME Agency:     HH Arranged:    HH Agency:  [facility]  Status of Service:  In process, will continue to follow  Medicare Important Message Given:    Date Medicare IM Given:    Medicare IM give by:    Date Additional Medicare IM Given:    Additional Medicare Important Message give by:     If discussed at Long Length of Stay Meetings, dates discussed:    Additional Comments:  Vangie Bicker, RN 06/24/2015, 1:41 PM

## 2015-06-24 NOTE — Progress Notes (Signed)
MRI does not show disproportionate ventriculomegaly. He is awake and eating dinner.   His daughter has some questions about underlying diagnosis, it appears that he was diagnosed by neuropsych testing in 2012, and he doe shave significant memory loss per his daughter. I suspect diagnosis is accurate.   I do wonder if changing SSRI to something slightly more activating could be useful, but would defer to his primary team if this would be pursued.   No further testing needed at the current time. Patient already has follow up scheduled with Dr. Everlena Cooper.   Please call with any further question or concerns.   Ritta Slot, MD [facility] [phone]  If 7pm- 7am, please page neurology on call as listed in AMION.

## 2015-06-25 DIAGNOSIS — F0280 Dementia in other diseases classified elsewhere without behavioral disturbance: Secondary | ICD-10-CM | POA: Diagnosis not present

## 2015-06-25 DIAGNOSIS — G309 Alzheimer's disease, unspecified: Secondary | ICD-10-CM | POA: Diagnosis not present

## 2015-06-25 DIAGNOSIS — R001 Bradycardia, unspecified: Secondary | ICD-10-CM | POA: Diagnosis not present

## 2015-06-25 DIAGNOSIS — I951 Orthostatic hypotension: Secondary | ICD-10-CM | POA: Diagnosis not present

## 2015-06-25 LAB — GLUCOSE, CAPILLARY: Glucose-Capillary: 148 mg/dL — ABNORMAL HIGH (ref 65–99)

## 2015-06-25 MED ORDER — POLYETHYLENE GLYCOL 3350 17 G PO PACK: 17.0000 g | PACK | Freq: Every day | ORAL | Status: DC

## 2015-06-25 MED ORDER — CITALOPRAM HYDROBROMIDE 20 MG PO TABS: 20.0000 mg | ORAL_TABLET | Freq: Every day | ORAL | Status: DC

## 2015-06-25 MED ORDER — BRIMONIDINE TARTRATE 0.2 % OP SOLN: 1.0000 [drp] | Freq: Three times a day (TID) | OPHTHALMIC | Status: DC

## 2015-06-25 NOTE — Care Management Obs Status (Signed)
MEDICARE OBSERVATION STATUS NOTIFICATION   Patient Details  Name: Willie Mayer. MRN: [id number] Date of Birth: 1933-05-21   Medicare Observation Status Notification Given:  Yes Explained Observation Status,  given copy at bedside. No voiced questions.     Yvone Neu, RN 06/25/2015, 2:01 PM

## 2015-06-25 NOTE — Progress Notes (Signed)
PT Cancellation Note  Patient Details Name: Willie Mayer. MRN: [id number] DOB: 09-19-1932   Cancelled Treatment:    Reason Eval/Treat Not Completed: Other (comment); RN reports pt to d/c soon.  Will hold on PT due to planned d/c.   Lamont Tant,CYNDI 06/25/2015, 3:59 PM Sheran Lawless, PT [phone] 06/25/2015

## 2015-06-28 NOTE — Discharge Summary (Signed)
Physician Discharge Summary  Willie Mayer. ZOX:[id number] DOB: 16-Oct-1932 DOA: 06/22/2015  PCP: Gaye Alken, MD  Admit date: 06/22/2015 Discharge date: 06/25/2015  Time spent: 25  minutes  Recommendations for Outpatient Follow-up:  1. Follow upw ith PCP in one to 2 weeks.    Discharge Diagnoses:  Principal Problem:   Bradycardia Active Problems:   Hypertension   Depression   Syncope   Hearing loss of both ears   Urinary incontinence   Alzheimer's disease   New left bundle branch block (LBBB)   Hyperlipidemia   Chronic kidney disease, stage III (moderate)   Fatigue   Orthostatic hypotension   Second degree AV block, Mobitz type I   Discharge Condition: improved  Diet recommendation: regular.   Filed Weights   06/23/15 0451 06/24/15 0513 06/25/15 0453  Weight: 105.688 kg (233 lb) 103.42 kg (228 lb) 105.098 kg (231 lb 11.2 oz)    History of present illness:  Patient is a 81 year old male with a past medical history of dementia, CVA, BPH, depression; presents for symptoms of fatigue and possible syncope. He was found to be bradycardic and cardiology consutled for EP eval and the need for PPM.  Because he had an episode of unresponsiveness, neurology consulted to see if its a neurological event. MRI of the brain done , which did not reveal any new stroke. He has chronic microvascular ischemia and an old lacunar infarct.   Hospital Course:  Bradycardia with New left bundle branch block (LBBB): Acute. Findings on EKG previously not present. Previous EKG showed a right bundle branch block. Last echocardiogram on 02/19/2015 showed EF of 60-65%. He was admitted to telemetry bed, Consulted EP, recommended for optimization of orthostatic hypotension, no indication for pacing at this time for the Mobitz 1 second degree heart block.     Glaucoma: Discussed with Dr Nile Riggs,. Change Alphagan to TID, continue with Azopt. Patient will need to follow up with Dr  Nile Riggs as soon as he is discharge to have eye pressure check.     Chronic kidney disease stage III: Patient with a creatinine of 1.3 on admission. Baseline creatinine ranges 1.01-1.52 it appears looking at previous admissions.gentle hydration. And creatinine improved.    Possible syncopal events: Will likely be related to patient's low heart rates. Neurology consulted for further recommendations, ordered MRi of the brain, showed chronic microvascular ischemi and old lacunar infarct.  -Orthostatic vital signs  are positive on 12/8 ordered IV fluids and compression stockings in addition to the maneuvers recommended by cardiology.fluids started and repeat orthostatics negative and he was discharged on 12/9 Fatigue: Likely secondary to above with decreased heart rate as well as patient also seen have mild signs of dehydration. Patient appears otherwise pleasant and happy no overt signs of depression. TSH and vit b12 are within normal limits. Gentle hydration on admission.  Plan for PT eval. Currently no signs of infection.  Also possibly from celexa, which he was taking 40 mg daily , decreased the dose to 20 mg daily.      BPH -Continue Flomax  Depression -Continue Celexa at a lower dose.   Urinary incontinence: Patient with no signs of infection on UA. Note patient does have a history of BPH and is on Flomax. -Check bladder scan for Urinary Retention. Place Foley if needed -Initially continuing Mirabegron, but will need to discontinue if found to have urinary retention   HLD -Continue pravastatin   Hypertension: Stable -Continue to monitor  Alzheimer's disease: Stable. Patient is pleasant  and does not have any behavioral disturbances. -Hold Namenda to avoid bradycardia.   Hearing loss of both ears: Stable. Patient wears hearing aids  Procedures:  none  Consultations:  neurology  Discharge Exam: Filed Vitals:   06/25/15 0453 06/25/15 1303  BP: 143/63 132/79   Pulse: 65 67  Temp: 97.4 F (36.3 C) 97.7 F (36.5 C)  Resp: 18 18    General: alert afebrile comfortable Cardiovascular: s1s2 Respiratory: ctab  Discharge Instructions   Discharge Instructions    Diet general    Complete by:  As directed      Discharge instructions    Complete by:  As directed   Follow up with PCP in 1 tto 2 weeks.          Discharge Medication List as of 06/25/2015  4:08 PM    START taking these medications   Details  brimonidine (ALPHAGAN) 0.2 % ophthalmic solution Place 1 drop into both eyes 3 (three) times daily., Starting 06/25/2015, Until Discontinued, Print    polyethylene glycol (MIRALAX / GLYCOLAX) packet Take 17 g by mouth daily., Starting 06/25/2015, Until Discontinued, Print      CONTINUE these medications which have CHANGED   Details  citalopram (CELEXA) 20 MG tablet Take 1 tablet (20 mg total) by mouth daily., Starting 06/25/2015, Until Discontinued, Print      CONTINUE these medications which have NOT CHANGED   Details  brinzolamide (AZOPT) 1 % ophthalmic suspension Place 1 drop into the right eye 2 (two) times daily., Until Discontinued, Historical Med    clopidogrel (PLAVIX) 75 MG tablet Take 1 tablet (75 mg total) by mouth daily., Starting 08/14/2013, Until Discontinued, Normal    mirabegron ER (MYRBETRIQ) 50 MG TB24 tablet Take 50 mg by mouth daily. , Until Discontinued, Historical Med    Multiple Vitamin (MULTIVITAMIN WITH MINERALS) TABS tablet Take 1 tablet by mouth at bedtime. , Until Discontinued, Historical Med    pravastatin (PRAVACHOL) 20 MG tablet TAKE ONE TABLET BY MOUTH ONCE DAILY, Normal    tamsulosin (FLOMAX) 0.4 MG CAPS capsule Take 0.4 mg by mouth daily after breakfast. Take one tablet by mouth once daily for prostate, Until Discontinued, Historical Med    Vitamin D, Ergocalciferol, (DRISDOL) 50000 UNITS CAPS capsule Take 50,000 Units by mouth daily., Until Discontinued, Historical Med      STOP taking these  medications     brimonidine-timolol (COMBIGAN) 0.2-0.5 % ophthalmic solution      memantine (NAMENDA) 10 MG tablet      bisacodyl (DULCOLAX) 5 MG EC tablet      enoxaparin (LOVENOX) 30 MG/0.3ML injection      HYDROcodone-acetaminophen (NORCO) 5-325 MG per tablet      lactobacillus acidophilus & bulgar (LACTINEX) chewable tablet      ondansetron (ZOFRAN) 4 MG tablet      senna-docusate (SENOKOT-S) 8.6-50 MG per tablet        No Known Allergies Follow-up Information    Follow up with Gaye Alken, MD. Schedule an appointment as soon as possible for a visit on 07/05/2015.   Specialty:  Family Medicine   Why:  post hospitalization visit. @11 ;00am   Contact information:   [address] [place] [address] [phone]        The results of significant diagnostics from this hospitalization (including imaging, microbiology, ancillary and laboratory) are listed below for reference.    Significant Diagnostic Studies: Mr Brain Wo Contrast  06/23/2015  CLINICAL DATA:  Fatigue and possible syncopal episode yesterday.  Dementia. Altered mental status. EXAM: MRI HEAD WITHOUT CONTRAST TECHNIQUE: Multiplanar, multiecho pulse sequences of the brain and surrounding structures were obtained without intravenous contrast. COMPARISON:  None. FINDINGS: The diffusion-weighted images demonstrate no evidence for acute or subacute infarction. No acute hemorrhage or mass lesion is present. Extensive periventricular and subcortical white matter changes are present bilaterally. There is a remote lacunar infarct at the level of the genu of the left internal capsule. White matter changes extend into the brainstem. The cerebellum is within normal limits. The internal auditory canals are within normal limits bilaterally. Flow is present in the major intracranial arteries. The globes and orbits are intact. Mild mucosal thickening is present in the maxillary sinuses bilaterally, left greater than  right. The remaining paranasal sinuses and the mastoid air cells are clear. The skullbase is within normal limits. Midline structures are otherwise unremarkable. IMPRESSION: 1. No acute intracranial abnormality. 2. Advanced atrophy and diffuse white matter disease. This likely reflects the sequela of chronic microvascular ischemia. 3. Remote lacunar infarct in the genu of the left internal capsule. Electronically Signed   By: Marin Roberts M.D.   On: 06/23/2015 20:48   Dg Chest Port 1 View  06/22/2015  CLINICAL DATA:  Cough.  Dementia. EXAM: PORTABLE CHEST 1 VIEW COMPARISON:  02/19/2015 FINDINGS: No cardiomegaly.  Stable normal aortic contour.  Negative hila. Chronic mild interstitial coarsening without focal pneumonia or edema. No effusion or pneumothorax. IMPRESSION: Stable.  No evidence of acute cardiopulmonary disease. Electronically Signed   By: Marnee Spring M.D.   On: 06/22/2015 19:00    Microbiology: Recent Results (from the past 240 hour(s))  Urine culture     Status: None   Collection Time: 06/23/15  2:22 PM  Result Value Ref Range Status   Specimen Description URINE, RANDOM  Final   Special Requests NONE  Final   Culture MULTIPLE SPECIES PRESENT, SUGGEST RECOLLECTION  Final   Report Status 06/24/2015 FINAL  Final     Labs: Basic Metabolic Panel:  Recent Labs Lab 06/22/15 1735 06/22/15 1829 06/22/15 2154 06/23/15 0354  NA 137  --   --  140  K 4.6  --   --  3.8  CL 102  --   --  105  CO2 27  --   --  28  GLUCOSE 112*  --   --  86  BUN 19  --   --  16  CREATININE 1.30*  --   --  1.14  CALCIUM 9.1  --   --  8.7*  MG  --  1.9  --   --   PHOS  --   --  3.8  --    Liver Function Tests: No results for input(s): AST, ALT, ALKPHOS, BILITOT, PROT, ALBUMIN in the last 168 hours. No results for input(s): LIPASE, AMYLASE in the last 168 hours. No results for input(s): AMMONIA in the last 168 hours. CBC:  Recent Labs Lab 06/22/15 1735  WBC 7.8  HGB 12.7*  HCT 39.1   MCV 88.7  PLT 164   Cardiac Enzymes: No results for input(s): CKTOTAL, CKMB, CKMBINDEX, TROPONINI in the last 168 hours. BNP: BNP (last 3 results) No results for input(s): BNP in the last 8760 hours.  ProBNP (last 3 results) No results for input(s): PROBNP in the last 8760 hours.  CBG:  Recent Labs Lab 06/25/15 1654  GLUCAP 148*       Signed:  Aundraya Dripps  [facility] 06/28/2015, 10:02 AM

## 2015-07-14 ENCOUNTER — Encounter: Payer: Self-pay | Admitting: Neurology

## 2015-07-14 ENCOUNTER — Ambulatory Visit (INDEPENDENT_AMBULATORY_CARE_PROVIDER_SITE_OTHER): Payer: Commercial Managed Care - HMO | Admitting: Neurology

## 2015-07-14 VITALS — BP 116/66 | HR 76 | Wt 231.0 lb

## 2015-07-14 DIAGNOSIS — F0150 Vascular dementia without behavioral disturbance: Secondary | ICD-10-CM | POA: Diagnosis not present

## 2015-07-14 DIAGNOSIS — G309 Alzheimer's disease, unspecified: Secondary | ICD-10-CM | POA: Diagnosis not present

## 2015-07-14 DIAGNOSIS — I679 Cerebrovascular disease, unspecified: Secondary | ICD-10-CM | POA: Diagnosis not present

## 2015-07-14 DIAGNOSIS — F0280 Dementia in other diseases classified elsewhere without behavioral disturbance: Secondary | ICD-10-CM

## 2015-07-14 DIAGNOSIS — I951 Orthostatic hypotension: Secondary | ICD-10-CM | POA: Diagnosis not present

## 2015-07-14 MED ORDER — GALANTAMINE HYDROBROMIDE 4 MG PO TABS: 4.0000 mg | ORAL_TABLET | Freq: Two times a day (BID) | ORAL | Status: DC

## 2015-07-14 NOTE — Patient Instructions (Signed)
1.  We will start galantamine 4mg .  Take 1 tablet twice daily for one month.  Call for refill.  If tolerating at that time, we will increase dose. 2.  I want to check a fasting lipid panel.  LDL goal should be less than 70.  Have this done with Dr. Zachery Dauer. 3.  Ask cardiologist about safety of Remeron, which is an antidepressant used for depression and to increase appetite. 4.  Wear thigh-high compression stockings 5.  Follow up in 6 months.

## 2015-07-14 NOTE — Progress Notes (Signed)
NEUROLOGY FOLLOW UP OFFICE NOTE  Willie Mayer [id number]  HISTORY OF PRESENT ILLNESS: Willie Mayer is an 81 year old right-handed man with history of dementia, stroke, DVT, hypertension, orthostatic hypotension and depression who follows up for Alzheimer's dementia.  Recent hospital admission notes, imaging of brain MRI and labs reviewed.  He is accompanied by his daughter and son who provides some history.    UPDATE: Over the past year, he has had gradual increased generalized weakness.  He is only able to ambulate short distances before needing to stop.  He has had bowel incontinence as well as bladder incontinence.  He sleeps most of the day, sometimes difficult to arouse.  Otherwise, he uses the wheelchair.  He has been experiencing dyspnea on exertion.  He does have orthostatic hypotension.  He was admitted to [facility] on 06/22/15 for fatigue and possible syncope.  He was found to be bradycardic.  EKG revealed new left bundle branch block.  MRI of the brain revealed advanced chronic small vessel disease, including remote left internal capsular stroke, but no acute stroke.  TSH was 1.726.  B12 was 496.  He once again demonstrated positive orthostatics and he was treated with IV fluids and compression stockings.  Namenda was discontinued as possible contributor to bradycardia.  Celexa was decreased to  daily.  Since discharge, he has been more irritable.  He doesn't eat much (one or two meals) and drinks a couple of Ensures a day.  He exhibits disinhibition and makes inappropriate remarks (including sexual).  He has not really exhibited any hallucinations or delusions. He has 24 hour nursing care.  HISTORY: Symptoms were first noted at least 2.5 years ago.  At first, he would often repeat questions.  He used to have a great sense of direction, however he began getting lost while driving.  His memory to detail used to be good.  He had an MRI of the brain performed around that time, which  revealed a stroke, age indeterminant.  Over the next two years, memory had progressively worsened.  He had a neuropsychological evaluation performed on 02/12/13, which revealed impairment in multiple domains and was felt to be a neurodegenerative process, rather than a cerebrovascular event.  He no longer drives.  He no longer manages his finances alone.  He has had 3 falls since moving down here.  He did not tolerate Aricept.  It made him fatigued, gave him GI distress, and caused aggressive behavior.   PAST MEDICAL HISTORY: Past Medical History  Diagnosis Date  . Dementia   . Hypertension   . History of CVA (cerebrovascular accident)   . History of DVT (deep vein thrombosis)   . History of pulmonary embolism   . Depression   . Glaucoma   . Urge incontinence   . BPH (benign prostatic hyperplasia)     s/p surgery that "didnt work"  . Stroke (HCC)   . Shortness of breath dyspnea   . New left bundle branch block (LBBB) 06/2015    MEDICATIONS: Current Outpatient Prescriptions on File Prior to Visit  Medication Sig Dispense Refill  . brimonidine (ALPHAGAN) 0.2 % ophthalmic solution Place 1 drop into both eyes 3 (three) times daily. 5 mL 2  . brinzolamide (AZOPT) 1 % ophthalmic suspension Place 1 drop into the right eye 2 (two) times daily.    . citalopram (CELEXA) 20 MG tablet Take 1 tablet (20 mg total) by mouth daily. 30 tablet 0  . clopidogrel (PLAVIX) 75  MG tablet Take 1 tablet (75 mg total) by mouth daily. (Patient taking differently: Take 75 mg by mouth at bedtime. ) 90 tablet 3  . mirabegron ER (MYRBETRIQ) 50 MG TB24 tablet Take 50 mg by mouth daily.     . Multiple Vitamin (MULTIVITAMIN WITH MINERALS) TABS tablet Take 1 tablet by mouth at bedtime.     . polyethylene glycol (MIRALAX / GLYCOLAX) packet Take 17 g by mouth daily. 14 each 0  . pravastatin (PRAVACHOL) 20 MG tablet TAKE ONE TABLET BY MOUTH ONCE DAILY (Patient taking differently: Take one tablet by mouth once daily for  cholesterol at bedtime) 90 tablet 0  . tamsulosin (FLOMAX) 0.4 MG CAPS capsule Take 0.4 mg by mouth daily after breakfast. Take one tablet by mouth once daily for prostate    . Vitamin D, Ergocalciferol, (DRISDOL) 50000 UNITS CAPS capsule Take 50,000 Units by mouth daily.     No current facility-administered medications on file prior to visit.    ALLERGIES: Allergies  Allergen Reactions  . Aricept [Donepezil Hcl]     FAMILY HISTORY: Family History  Problem Relation Age of Onset  . Alcoholism Father     died in 92s related to complciations  . Heart failure Mother   . Arrhythmia Mother     SOCIAL HISTORY: Social History   Social History  . Marital Status: Married    Spouse Name: N/A  . Number of Children: N/A  . Years of Education: N/A   Occupational History  . Insurance     Social History Main Topics  . Smoking status: Former Smoker -- 15.00 packs/day    Quit date: 07/17/1965  . Smokeless tobacco: Never Used  . Alcohol Use: Yes     Comment: occasional gin and tonic  . Drug Use: No  . Sexual Activity: No   Other Topics Concern  . Not on file   Social History Narrative   Recently moved from [place] where he lived with his wife who has parkinsonian dementia. They were independent but moved down here after recent fall and knowledge of dementia. Live in independent living facility and indendent in ADLs.       Currently living at [facility] living. Lives with his wife. They help with medication delivery and activities of daily life.     REVIEW OF SYSTEMS: Constitutional: No fevers, chills, or sweats, no generalized fatigue, change in appetite Eyes: No visual changes, double vision, eye pain Ear, nose and throat: No hearing loss, ear pain, nasal congestion, sore throat Cardiovascular: No chest pain, palpitations Respiratory:  No shortness of breath at rest or with exertion, wheezes GastrointestinaI: No nausea, vomiting, diarrhea, abdominal pain, fecal  incontinence Genitourinary:  incontinence Musculoskeletal:  No neck pain, back pain Integumentary: No rash, pruritus, skin lesions Neurological: as above Psychiatric: No depression, insomnia, anxiety Endocrine: No palpitations, fatigue, diaphoresis, mood swings, change in appetite, change in weight, increased thirst Hematologic/Lymphatic:  No anemia, purpura, petechiae. Allergic/Immunologic: no itchy/runny eyes, nasal congestion, recent allergic reactions, rashes  PHYSICAL EXAM: Filed Vitals:   07/14/15 1521  BP: 116/66  Pulse: 76   General: No acute distress.  Patient appears well-groomed.   Head:  Normocephalic/atraumatic Eyes:  Fundoscopic exam unremarkable without vessel changes, exudates, hemorrhages or papilledema. Neck: supple, no paraspinal tenderness, full range of motion Heart:  Regular rate and rhythm Lungs:  Clear to auscultation bilaterally Back: No paraspinal tenderness Neurological Exam: alert and oriented to person, place, and time (except date). Attention span and concentration intact,  recent memory poor, remote memory intact, fund of knowledge intact.  Speech fluent and not dysarthric, language intact.    Montreal Cognitive Assessment  07/14/2015 01/12/2015  Visuospatial/ Executive (0/5) 2 3  Naming (0/3) 3 2  Attention: Read list of digits (0/2) 2 1  Attention: Read list of letters (0/1) 1 1  Attention: Serial 7 subtraction starting at 100 (0/3) 3 3  Language: Repeat phrase (0/2) 1 1  Language : Fluency (0/1) 0 1  Abstraction (0/2) 1 2  Delayed Recall (0/5) 0 0  Orientation (0/6) 3 3  Total 16 17  Adjusted Score (based on education) 16 17   CN II-XII intact. Fundoscopic exam unremarkable without vessel changes, exudates, hemorrhages or papilledema.  Bulk and tone normal, muscle strength 5/5 throughout, but reduced finger-thumb tapping speed and amplitude on the right.  Sensation to pinprick and vibration reduced in left hand and leg.  Deep tendon reflexes 2+  throughout, toes downgoing.  Finger to nose intact.    IMPRESSION: Mixed vascular and Alzheimer's disease Cerebrovascular disease Orthostatic hypotension  PLAN: 1.  Will try galantamine 4mg  twice daily.  If tolerating in one month, we can increase dose to 8mg  twice daily.  Bradycardia and hypotension is not a typical side effect of Namenda, so this may still be an option. 2.  Advised to wear thigh-high compression stockings 3.  Remeron may be helpful to address mood and poor appetite.  Advised that they ask his cardiologist regarding any contraindication and we can start it in place of Celexa. 4.  Continue Plavix for secondary stroke prevention 5.  Continue Pravastatin 20mg  daily.  Check fasting lipid panel (LDL goal should be less than 70) 6.  Follow up in 6 months.   45 minutes spent face to face with patient, over 50% spent discussing diagnosis, prognosis and management.  Shon Millet, DO  CC:  Juluis Rainier, MD

## 2015-07-15 NOTE — Progress Notes (Signed)
Chart forwarded.  

## 2015-07-16 ENCOUNTER — Telehealth: Payer: Self-pay | Admitting: Cardiology

## 2015-07-16 NOTE — Telephone Encounter (Signed)
Received records from [facility] for appointment on 08/02/15 with Corine Shelter, PA.  Records given to [employer] (medical records) for Luke's schedule on 08/02/15. lp

## 2015-08-02 ENCOUNTER — Encounter: Payer: Self-pay | Admitting: Cardiology

## 2015-08-02 ENCOUNTER — Ambulatory Visit (INDEPENDENT_AMBULATORY_CARE_PROVIDER_SITE_OTHER): Payer: Commercial Managed Care - HMO | Admitting: Cardiology

## 2015-08-02 VITALS — BP 80/53 | HR 64 | Ht 74.0 in | Wt 236.2 lb

## 2015-08-02 DIAGNOSIS — Z8673 Personal history of transient ischemic attack (TIA), and cerebral infarction without residual deficits: Secondary | ICD-10-CM

## 2015-08-02 DIAGNOSIS — G308 Other Alzheimer's disease: Secondary | ICD-10-CM | POA: Diagnosis not present

## 2015-08-02 DIAGNOSIS — I951 Orthostatic hypotension: Secondary | ICD-10-CM

## 2015-08-02 DIAGNOSIS — F0280 Dementia in other diseases classified elsewhere without behavioral disturbance: Secondary | ICD-10-CM

## 2015-08-02 DIAGNOSIS — E785 Hyperlipidemia, unspecified: Secondary | ICD-10-CM | POA: Diagnosis not present

## 2015-08-02 DIAGNOSIS — I447 Left bundle-branch block, unspecified: Secondary | ICD-10-CM

## 2015-08-02 DIAGNOSIS — N400 Enlarged prostate without lower urinary tract symptoms: Secondary | ICD-10-CM

## 2015-08-02 NOTE — Progress Notes (Signed)
08/02/2015 Joellen Jersey.   23-Nov-1932  [id number]  Primary Physician Gaye Alken, MD Primary Cardiologist: Dr Swaziland  HPI:  82 y/o male with a history of Alzheimer's dementia. We saw him in the hospital for Type 1 second degree AVB and RBBB with bradycardia. It was not any of his symptoms then were from this rhythm. He also has orthostatic B/P. Compression stockings have been recommended, they are on order according to the pt's daughter. He is in the office today for post hospital follow up. She tells me PT was stopped as he made no progress. He requires 24 hr supervision. He is in a wheelchair today. She tells me he doesn't get up without assistance so it's hard to say if he is symptomatic from his orthostasis. He has not had any syncopal episodes.  Dr Everlena Cooper (neurology also asked about switching Celexa to Remeron and I could see no reason not from a cardiac standpoint if he feels it will help.    Current Outpatient Prescriptions  Medication Sig Dispense Refill  . brimonidine (ALPHAGAN) 0.2 % ophthalmic solution Place 1 drop into both eyes 3 (three) times daily. 5 mL 2  . brinzolamide (AZOPT) 1 % ophthalmic suspension Place 1 drop into the right eye 2 (two) times daily.    . citalopram (CELEXA) 20 MG tablet Take 1 tablet (20 mg total) by mouth daily. 30 tablet 0  . clopidogrel (PLAVIX) 75 MG tablet Take 1 tablet (75 mg total) by mouth daily. (Patient taking differently: Take 75 mg by mouth at bedtime. ) 90 tablet 3  . galantamine (RAZADYNE) 4 MG tablet Take 1 tablet (4 mg total) by mouth 2 (two) times daily with a meal. 60 tablet 0  . mirabegron ER (MYRBETRIQ) 50 MG TB24 tablet Take 50 mg by mouth daily.     . Multiple Vitamin (MULTIVITAMIN WITH MINERALS) TABS tablet Take 1 tablet by mouth at bedtime.     . pravastatin (PRAVACHOL) 20 MG tablet TAKE ONE TABLET BY MOUTH ONCE DAILY (Patient taking differently: Take one tablet by mouth once daily for cholesterol at bedtime) 90  tablet 0  . tamsulosin (FLOMAX) 0.4 MG CAPS capsule Take 0.4 mg by mouth at bedtime.    . Vitamin D, Ergocalciferol, (DRISDOL) 50000 UNITS CAPS capsule Take 50,000 Units by mouth daily.     No current facility-administered medications for this visit.    Allergies  Allergen Reactions  . Aricept [Donepezil Hcl]     Social History   Social History  . Marital Status: Married    Spouse Name: N/A  . Number of Children: N/A  . Years of Education: N/A   Occupational History  . Insurance     Social History Main Topics  . Smoking status: Former Smoker -- 15.00 packs/day    Quit date: 07/17/1965  . Smokeless tobacco: Never Used  . Alcohol Use: Yes     Comment: occasional gin and tonic  . Drug Use: No  . Sexual Activity: No   Other Topics Concern  . Not on file   Social History Narrative   Recently moved from [place] where he lived with his wife who has parkinsonian dementia. They were independent but moved down here after recent fall and knowledge of dementia. Live in independent living facility and indendent in ADLs.       Currently living at [facility] living. Lives with his wife. They help with medication delivery and activities of daily life.  Review of Systems: General: negative for chills, fever, night sweats or weight changes.  Cardiovascular: negative for chest pain, dyspnea on exertion, edema, orthopnea, palpitations, paroxysmal nocturnal dyspnea or shortness of breath Dermatological: negative for rash Respiratory: negative for cough or wheezing Urologic: negative for hematuria Some problems with starting to void Abdominal: negative for nausea, vomiting, diarrhea, bright red blood per rectum, melena, or hematemesis Neurologic: negative for visual changes, syncope, or dizziness All other systems reviewed and are otherwise negative except as noted above.    Blood pressure 80/53, pulse 64, height 6\' 2"  (1.88 m), weight 236 lb 3.2 oz (107.14 kg).  General  appearance: alert, cooperative, no distress and in wheel chair, HOH Neck: no carotid bruit Lungs: clear to auscultation bilaterally Heart: regular rate and rhythm Extremities: trace edema both LE Skin: cool pale dry Neurologic: Grossly normal he was awake and alert. He did not offer any history   ASSESSMENT AND PLAN:   Orthostatic hypotension Referred today by PCP for persistent orthostatic B/P  Alzheimer's disease Pt appears to be deteriorating. His daughter says physical therapy was stopped because he was making no progress.   New left bundle branch block (LBBB) Dec 2016 with type 1 2nd degree AVB. Not clear he is symptomatic from this.   Hyperlipidemia On low dose statin  History of CVA (cerebrovascular accident) On Plavix Rx  BPH (benign prostatic hyperplasia) He was changed from Papaflo to Flomax secondary to cost.    PLAN  Discussed with the pt's daughter. No indication for a pacemaker at this time and she is certainly not pushing for this. In regards to his orthostatic B/P- its not clear to me that his symptomatic from this. He is basically in a wheel chair and doesn't get up without assistance. There is no history of orthostatic syncope. I suggested she try giving the Flomax Q HS as it can cause some hypotension. Once she gets the compression stockings she can liberalize his sodium intake. He could tolerate the abdominal binder. I would try and avoid Florinef as it may make his edema worse and its not clearly indicated at this point and Midodrine has several possible adverse side effects. F/U Dr Swaziland in 3 months.  Corine Shelter K PA-C 08/02/2015 4:29 PM

## 2015-08-02 NOTE — Patient Instructions (Signed)
TAKE THE FLOMAX AT BEDTIME.  Your physician recommends that you schedule a follow-up appointment in: 3 MONTHS WITH DR. Swaziland

## 2015-08-02 NOTE — Assessment & Plan Note (Signed)
He was changed from Papaflo to Flomax secondary to cost.

## 2015-08-02 NOTE — Assessment & Plan Note (Signed)
On Plavix Rx

## 2015-08-02 NOTE — Assessment & Plan Note (Signed)
Referred today by PCP for persistent orthostatic B/P

## 2015-08-02 NOTE — Assessment & Plan Note (Signed)
Pt appears to be deteriorating. His daughter says physical therapy was stopped because he was making no progress.

## 2015-08-02 NOTE — Assessment & Plan Note (Signed)
On low dose statin 

## 2015-08-02 NOTE — Assessment & Plan Note (Signed)
Dec 2016 with type 1 2nd degree AVB. Not clear he is symptomatic from this.

## 2015-08-09 ENCOUNTER — Telehealth: Payer: Self-pay

## 2015-08-09 MED ORDER — MIRTAZAPINE 15 MG PO TABS: 15.0000 mg | ORAL_TABLET | Freq: Every day | ORAL | Status: DC

## 2015-08-09 MED ORDER — GALANTAMINE HYDROBROMIDE 8 MG PO TABS: 8.0000 mg | ORAL_TABLET | Freq: Two times a day (BID) | ORAL | Status: DC

## 2015-08-09 NOTE — Telephone Encounter (Signed)
Daughter called in. Pt has now been on the Razadyne for a month. Tolerating well. Would like to go ahead and increase dose. Daughter also mention ou had discussed starting Remeron if okayed by cardiologist. Pt was recently seen by cardiologist who stated that he had no problem with patient being on Remeron, however, he did not want to manage medication as he does not see patient enough. Please advise.   Diane [phone]

## 2015-08-09 NOTE — Telephone Encounter (Signed)
Increase Razadyne to 8mg  twice daily and have her call again in 4 weeks with update (we can increase dose further at that time).  Have Mr. Lindmark decrease citalopram to 10mg  daily for 7 days, then stop.  At that time, he may start Remeron 15mg  at bedtime.

## 2015-08-09 NOTE — Telephone Encounter (Signed)
Diane aware. Will taper down citalopram before starting Remeron. Both medication sent to pharmacy.

## 2015-08-20 ENCOUNTER — Encounter: Payer: Self-pay | Admitting: Neurology

## 2015-09-14 ENCOUNTER — Telehealth: Payer: Self-pay

## 2015-09-14 NOTE — Telephone Encounter (Signed)
Daughter called with concerns about patient's mood. States that he has gotten mean since December when he was started on the Remeron and Razadyne. It has been getting progressively worse, has had a very rough last 2 weeks. Patient is having Verbal outburst, cussing out family and caregivers. He is having very vivid dreams. He is also very difficult to get to wake. Daughter states that when he first was put on medications, they had the opposite affect on him, he was joyous- singing, awake more frequently, talking more, generally in a better mood.  Daughter states she knows mood issues will come with the illness, but due to new medications and rapid onset was hoping there was another culprit that would be easy to correct. Please advise.   Sedalia Muta, daughter, [phone]

## 2015-09-15 NOTE — Telephone Encounter (Signed)
Left message on machine for daughter w/ Dr. Moises Blood instructions.

## 2015-09-15 NOTE — Telephone Encounter (Signed)
I would like to stop the Remeron and see how he does through rest of week and weekend.  I would like her to call Monday with update.

## 2015-09-21 ENCOUNTER — Telehealth: Payer: Self-pay | Admitting: Neurology

## 2015-09-21 NOTE — Telephone Encounter (Signed)
Pt daughter diane called and said dad is doing a lot better on the medication now Diane phone number is [phone]

## 2015-09-22 NOTE — Telephone Encounter (Signed)
Left message informing Dianne that I will let Dr. Everlena Cooper know.

## 2015-11-03 ENCOUNTER — Emergency Department (HOSPITAL_COMMUNITY): Payer: Commercial Managed Care - HMO

## 2015-11-03 ENCOUNTER — Emergency Department (HOSPITAL_COMMUNITY)
Admission: EM | Admit: 2015-11-03 | Discharge: 2015-11-03 | Disposition: A | Discharge status: Home / Self Care | Payer: Commercial Managed Care - HMO | Attending: Emergency Medicine | Admitting: Emergency Medicine

## 2015-11-03 ENCOUNTER — Encounter (HOSPITAL_COMMUNITY): Payer: Self-pay

## 2015-11-03 DIAGNOSIS — Z87891 Personal history of nicotine dependence: Secondary | ICD-10-CM | POA: Diagnosis not present

## 2015-11-03 DIAGNOSIS — W01198A Fall on same level from slipping, tripping and stumbling with subsequent striking against other object, initial encounter: Secondary | ICD-10-CM | POA: Diagnosis not present

## 2015-11-03 DIAGNOSIS — F0280 Dementia in other diseases classified elsewhere without behavioral disturbance: Secondary | ICD-10-CM | POA: Diagnosis not present

## 2015-11-03 DIAGNOSIS — Z8673 Personal history of transient ischemic attack (TIA), and cerebral infarction without residual deficits: Secondary | ICD-10-CM | POA: Diagnosis not present

## 2015-11-03 DIAGNOSIS — Z86711 Personal history of pulmonary embolism: Secondary | ICD-10-CM | POA: Insufficient documentation

## 2015-11-03 DIAGNOSIS — Y998 Other external cause status: Secondary | ICD-10-CM | POA: Diagnosis not present

## 2015-11-03 DIAGNOSIS — Z87448 Personal history of other diseases of urinary system: Secondary | ICD-10-CM | POA: Diagnosis not present

## 2015-11-03 DIAGNOSIS — S50812A Abrasion of left forearm, initial encounter: Secondary | ICD-10-CM | POA: Diagnosis not present

## 2015-11-03 DIAGNOSIS — S01312A Laceration without foreign body of left ear, initial encounter: Secondary | ICD-10-CM | POA: Insufficient documentation

## 2015-11-03 DIAGNOSIS — H409 Unspecified glaucoma: Secondary | ICD-10-CM | POA: Diagnosis not present

## 2015-11-03 DIAGNOSIS — Z7902 Long term (current) use of antithrombotics/antiplatelets: Secondary | ICD-10-CM | POA: Diagnosis not present

## 2015-11-03 DIAGNOSIS — Y9389 Activity, other specified: Secondary | ICD-10-CM | POA: Diagnosis not present

## 2015-11-03 DIAGNOSIS — I10 Essential (primary) hypertension: Secondary | ICD-10-CM | POA: Diagnosis not present

## 2015-11-03 DIAGNOSIS — Z86718 Personal history of other venous thrombosis and embolism: Secondary | ICD-10-CM | POA: Insufficient documentation

## 2015-11-03 DIAGNOSIS — G309 Alzheimer's disease, unspecified: Secondary | ICD-10-CM | POA: Insufficient documentation

## 2015-11-03 DIAGNOSIS — N400 Enlarged prostate without lower urinary tract symptoms: Secondary | ICD-10-CM | POA: Diagnosis not present

## 2015-11-03 DIAGNOSIS — Y92009 Unspecified place in unspecified non-institutional (private) residence as the place of occurrence of the external cause: Secondary | ICD-10-CM | POA: Diagnosis not present

## 2015-11-03 DIAGNOSIS — F329 Major depressive disorder, single episode, unspecified: Secondary | ICD-10-CM | POA: Insufficient documentation

## 2015-11-03 DIAGNOSIS — W19XXXA Unspecified fall, initial encounter: Secondary | ICD-10-CM

## 2015-11-03 DIAGNOSIS — Z79899 Other long term (current) drug therapy: Secondary | ICD-10-CM | POA: Insufficient documentation

## 2015-11-03 DIAGNOSIS — Z23 Encounter for immunization: Secondary | ICD-10-CM | POA: Diagnosis not present

## 2015-11-03 MED ORDER — CEPHALEXIN 500 MG PO CAPS: 500.0000 mg | ORAL_CAPSULE | Freq: Four times a day (QID) | ORAL | Status: DC

## 2015-11-03 MED ORDER — CEPHALEXIN 500 MG PO CAPS
500.0000 mg | ORAL_CAPSULE | Freq: Once | ORAL | Status: AC
  Administered 2015-11-03: 500 mg via ORAL
  Filled 2015-11-03: qty 1

## 2015-11-03 MED ORDER — TETANUS-DIPHTH-ACELL PERTUSSIS 5-2.5-18.5 LF-MCG/0.5 IM SUSP
0.5000 mL | Freq: Once | INTRAMUSCULAR | Status: AC
  Administered 2015-11-03: 0.5 mL via INTRAMUSCULAR
  Filled 2015-11-03: qty 0.5

## 2015-11-03 MED ORDER — LIDOCAINE-EPINEPHRINE 1 %-1:100000 IJ SOLN
10.0000 mL | Freq: Once | INTRAMUSCULAR | Status: AC
  Administered 2015-11-03: 10 mL
  Filled 2015-11-03: qty 1

## 2015-11-03 NOTE — Progress Notes (Signed)
CSW met with patient at bedside. Patient was alert and oriented. There was no family present.  Per note, patient presents to [facility] due to fall. Patient stated, " I dont even remember falling. I just remember being on the ground". Patient states that he does not fall often, and states that this is his only falling incident within the past 6 months.  Patient informed CSW that he lives at home with his wife in [place]. Patient states prior to fall he has been able to complete his ADL's independently. Patient states that he walks with no equipment.  Patient informed CSW that he feels as though he has a good support system. Patient states that he has no questions at this time.   , LCSWA [phone] ED CSW 11/03/2015 6:19 PM  

## 2015-11-03 NOTE — ED Provider Notes (Signed)
CSN: [id number]     Arrival date & time 11/03/15  1751 History   First MD Initiated Contact with Patient 11/03/15 1818     Chief Complaint  Patient presents with  . Fall     (Consider location/radiation/quality/duration/timing/severity/associated sxs/prior Treatment) HPI Comments: 82 year old male with a history of Alzheimer's dementia, hypertension, DVT and PE, stroke, on chronic Plavix, and BPH presents to the emergency department for evaluation of ear injury after a fall. Patient with a witnessed fall today at home secondary to tripping on his shoe. Staff report from the patient's residence that the patient was awake after falling, but did not respond for approximately 1 minute. He hit his ear on a table during the fall. Patient has no complaints of pain. He denies headache, nausea, vomiting, extremity numbness or weakness, and neck pain. He denies any lightheadedness, dizziness, or shortness of breath prior to falling.  Patient is a 82 y.o. male presenting with fall. The history is provided by the patient and a relative. No language interpreter was used.  Fall Pertinent negatives include no chest pain, headaches, nausea, neck pain or vomiting.    Past Medical History  Diagnosis Date  . Dementia   . Hypertension   . History of CVA (cerebrovascular accident)   . History of DVT (deep vein thrombosis)   . History of pulmonary embolism   . Depression   . Glaucoma   . Urge incontinence   . BPH (benign prostatic hyperplasia)     s/p surgery that "didnt work"  . Stroke (HCC)   . Shortness of breath dyspnea   . New left bundle branch block (LBBB) 06/2015   Past Surgical History  Procedure Laterality Date  . Replacement total knee bilateral    . Bph surgery    . Joint replacement    . Intramedullary (im) nail intertrochanteric Left 02/20/2015    Procedure: LEFT  INTERTROCHANTRIC HIP FRACTURE;  Surgeon: Eldred Manges, MD;  Location: [facility];  Service: Orthopedics;  Laterality: Left;    Family History  Problem Relation Age of Onset  . Alcoholism Father     died in 35s related to complciations  . Heart failure Mother   . Arrhythmia Mother    Social History  Substance Use Topics  . Smoking status: Former Smoker -- 15.00 packs/day    Quit date: 07/17/1965  . Smokeless tobacco: Never Used  . Alcohol Use: Yes     Comment: occasional gin and tonic    Review of Systems  Cardiovascular: Negative for chest pain.  Gastrointestinal: Negative for nausea and vomiting.  Musculoskeletal: Negative for neck pain.  Skin: Positive for wound.  Neurological: Negative for headaches.  All other systems reviewed and are negative.   Allergies  Aricept  Home Medications   Prior to Admission medications   Medication Sig Start Date End Date Taking? Authorizing Provider  brimonidine (ALPHAGAN) 0.2 % ophthalmic solution Place 1 drop into both eyes 3 (three) times daily. 06/25/15   Kathlen Mody, MD  brinzolamide (AZOPT) 1 % ophthalmic suspension Place 1 drop into the right eye 2 (two) times daily.    Historical Provider, MD  citalopram (CELEXA) 20 MG tablet Take 1 tablet (20 mg total) by mouth daily. 06/25/15   Kathlen Mody, MD  clopidogrel (PLAVIX) 75 MG tablet Take 1 tablet (75 mg total) by mouth daily. Patient taking differently: Take 75 mg by mouth at bedtime.  08/14/13   Myra Rude, MD  galantamine (RAZADYNE) 8 MG tablet Take 1  tablet (8 mg total) by mouth 2 (two) times daily with a meal. 08/09/15   Drema Dallas, DO  mirabegron ER (MYRBETRIQ) 50 MG TB24 tablet Take 50 mg by mouth daily.     Historical Provider, MD  mirtazapine (REMERON) 15 MG tablet Take 1 tablet (15 mg total) by mouth at bedtime. 08/09/15   Drema Dallas, DO  Multiple Vitamin (MULTIVITAMIN WITH MINERALS) TABS tablet Take 1 tablet by mouth at bedtime.     Historical Provider, MD  pravastatin (PRAVACHOL) 20 MG tablet TAKE ONE TABLET BY MOUTH ONCE DAILY Patient taking differently: Take one tablet by mouth once  daily for cholesterol at bedtime 06/30/14   Myra Rude, MD  tamsulosin (FLOMAX) 0.4 MG CAPS capsule Take 0.4 mg by mouth at bedtime.    Historical Provider, MD  Vitamin D, Ergocalciferol, (DRISDOL) 50000 UNITS CAPS capsule Take 50,000 Units by mouth daily.    Historical Provider, MD   BP 141/63 mmHg  Pulse 63  Temp(Src) 98.2 F (36.8 C) (Oral)  Resp 14  SpO2 98%   Physical Exam  Constitutional: He is oriented to person, place, and time. He appears well-developed and well-nourished. No distress.  Nontoxic/nonseptic appearing  HENT:  Head: Normocephalic.  Right Ear: Tympanic membrane, external ear and ear canal normal.  Left Ear: Left ear exhibits lacerations.  Ears:  Mouth/Throat: Oropharynx is clear and moist. No oropharyngeal exudate.  Eyes: Conjunctivae and EOM are normal. Pupils are equal, round, and reactive to light. No scleral icterus.  Neck: Normal range of motion.  No TTP to the cervical midline. No bony deformities, step offs, or crepitus. Normal ROM of neck.  Cardiovascular: Normal rate, regular rhythm and intact distal pulses.   Pulmonary/Chest: Effort normal and breath sounds normal. No respiratory distress. He has no wheezes. He has no rales.  Respirations even and unlabored.  Musculoskeletal: Normal range of motion.  Neurological: He is alert and oriented to person, place, and time. No cranial nerve deficit. He exhibits normal muscle tone. Coordination normal.  GCS 15. Speech is goal oriented. Patient answering questions appropriately and following commands. No focal deficits noted. Patient moving all extremities.  Skin: Skin is warm and dry. No rash noted. He is not diaphoretic. No erythema. No pallor.  Abrasions to L forearm  Psychiatric: He has a normal mood and affect. His behavior is normal.  Nursing note and vitals reviewed.   ED Course  Procedures (including critical care time) Labs Review Labs Reviewed - No data to display  Imaging Review Ct Head  Wo Contrast  11/03/2015  CLINICAL DATA:  Fall, initially not verbally response of after the fall. Struck left side of head on table. EXAM: CT HEAD WITHOUT CONTRAST TECHNIQUE: Contiguous axial images were obtained from the base of the skull through the vertex without intravenous contrast. COMPARISON:  06/23/2015 FINDINGS: Remote infarct involving the left globus pallidus nucleus and adjacent genu of the left internal capsule, chronic in appearance. Periventricular white matter and corona radiata hypodensities favor chronic ischemic microvascular white matter disease. Otherwise, the brainstem, cerebellum, cerebral peduncles, thalami, basal ganglia, basilar cisterns, and ventricular system appear within normal limits. No intracranial hemorrhage, mass lesion, or acute CVA. Scalp soft tissue swelling above the left ear. Chronic bilateral maxillary, ethmoid, and left sphenoid sinusitis. No fracture underlying the scalp hematoma. IMPRESSION: 1. No acute intracranial findings. 2. Left-sided scalp hematoma just above the ear. 3. Periventricular white matter and corona radiata hypodensities favor chronic ischemic microvascular white matter disease. 4. Remote  infarct involving the left globus pallidus nucleus and adjacent internal capsule. 5. Chronic maxillary, ethmoid, and left sphenoid sinusitis. Electronically Signed   By: Gaylyn Rong M.D.   On: 11/03/2015 19:00     I have personally reviewed and evaluated these images and lab results as part of my medical decision-making.   EKG Interpretation None         LACERATION REPAIR Performed by: Antony Madura Authorized by: Antony Madura Consent: Verbal consent obtained. Risks and benefits: risks, benefits and alternatives were discussed Consent given by: patient Patient identity confirmed: provided demographic data Prepped and Draped in normal sterile fashion Wound explored  Laceration Location: L ear  Laceration Length: 2.5cm  No Foreign Bodies  seen or palpated  Anesthesia: regional block  Local anesthetic: lidocaine 1% with epinephrine  Anesthetic total: 8 ml  Irrigation method: syringe Amount of cleaning: standard  Skin closure: 5-0 vicryl  Number of sutures: 4  Technique: simple interrupted  Patient tolerance: Patient tolerated the procedure well with no immediate complications.  MDM   Final diagnoses:  Laceration of ear, left, initial encounter  Fall, initial encounter    82 year old male presents to the emergency department for evaluation of ear laceration secondary to a witnessed fall. Fall was mechanical in nature. CT head is negative for acute intracranial hemorrhage, hydrocephalus, or skull fracture. Laceration of ear noted. Tetanus updated. Laceration closed with Vicryl sutures without complication. Patient to be started on Keflex for infection prophylaxis. Tylenol advised for pain. No indication for further emergent workup. We'll referred to ENT for follow-up and wound care. Return precautions given at discharge. Patient and daughter are agreeable to plan with no unaddressed concerns.   Filed Vitals:   11/03/15 1801 11/03/15 1807 11/03/15 1830 11/03/15 2030  BP:  141/63 143/77 148/80  Pulse:  63 63 75  Temp:  98.2 F (36.8 C)    TempSrc:  Oral    Resp:  14 13 22   SpO2: 99% 98% 97% 99%     Antony Madura, PA-C 11/03/15 2132  Loren Racer, MD 11/06/15 [phone]

## 2015-11-03 NOTE — Discharge Instructions (Signed)
Take Keflex to prevent infection. You may take Tylenol as needed for pain. Follow-up with an ear, nose, and throat doctor to ensure proper healing. Return to the emergency department if signs develop.  Facial Laceration  A facial laceration is a cut on the face. These injuries can be painful and cause bleeding. Lacerations usually heal quickly, but they need special care to reduce scarring. DIAGNOSIS  Your health care provider will take a medical history, ask for details about how the injury occurred, and examine the wound to determine how deep the cut is. TREATMENT  Some facial lacerations may not require closure. Others may not be able to be closed because of an increased risk of infection. The risk of infection and the chance for successful closure will depend on various factors, including the amount of time since the injury occurred. The wound may be cleaned to help prevent infection. If closure is appropriate, pain medicines may be given if needed. Your health care provider will use stitches (sutures), wound glue (adhesive), or skin adhesive strips to repair the laceration. These tools bring the skin edges together to allow for faster healing and a better cosmetic outcome. If needed, you may also be given a tetanus shot. HOME CARE INSTRUCTIONS  Only take over-the-counter or prescription medicines as directed by your health care provider.  Follow your health care provider's instructions for wound care. These instructions will vary depending on the technique used for closing the wound. For Sutures:  Keep the wound clean and dry.   If you were given a bandage (dressing), you should change it at least once a day. Also change the dressing if it becomes wet or dirty, or as directed by your health care provider.   Wash the wound with soap and water 2 times a day. Rinse the wound off with water to remove all soap. Pat the wound dry with a clean towel.   After cleaning, apply a thin layer of the  antibiotic ointment recommended by your health care provider. This will help prevent infection and keep the dressing from sticking.   You may shower as usual after the first 24 hours. Do not soak the wound in water until the sutures are removed.   Get your sutures removed as directed by your health care provider. With facial lacerations, sutures should usually be taken out after 4-5 days to avoid stitch marks.   Wait a few days after your sutures are removed before applying any makeup. For Skin Adhesive Strips:  Keep the wound clean and dry.   Do not get the skin adhesive strips wet. You may bathe carefully, using caution to keep the wound dry.   If the wound gets wet, pat it dry with a clean towel.   Skin adhesive strips will fall off on their own. You may trim the strips as the wound heals. Do not remove skin adhesive strips that are still stuck to the wound. They will fall off in time.  For Wound Adhesive:  You may briefly wet your wound in the shower or bath. Do not soak or scrub the wound. Do not swim. Avoid periods of heavy sweating until the skin adhesive has fallen off on its own. After showering or bathing, gently pat the wound dry with a clean towel.   Do not apply liquid medicine, cream medicine, ointment medicine, or makeup to your wound while the skin adhesive is in place. This may loosen the film before your wound is healed.   If a  dressing is placed over the wound, be careful not to apply tape directly over the skin adhesive. This may cause the adhesive to be pulled off before the wound is healed.   Avoid prolonged exposure to sunlight or tanning lamps while the skin adhesive is in place.  The skin adhesive will usually remain in place for 5-10 days, then naturally fall off the skin. Do not pick at the adhesive film.  After Healing: Once the wound has healed, cover the wound with sunscreen during the day for 1 full year. This can help minimize scarring. Exposure  to ultraviolet light in the first year will darken the scar. It can take 1-2 years for the scar to lose its redness and to heal completely.  SEEK MEDICAL CARE IF:  You have a fever. SEEK IMMEDIATE MEDICAL CARE IF:  You have redness, pain, or swelling around the wound.   You see ayellowish-white fluid (pus) coming from the wound.    This information is not intended to replace advice given to you by your health care provider. Make sure you discuss any questions you have with your health care provider.   Document Released: 08/10/2004 Document Revised: 07/24/2014 Document Reviewed: 02/13/2013 Elsevier Interactive Patient Education [employer]

## 2015-11-03 NOTE — ED Notes (Signed)
Per EMS- Patient is a resident of [place]. Patient had a witnessed fall today. Patient ripped on his own shoe. Staff reported that the patient was awake after falling, but did not respond verbally for approx. 1 minute. Patient hit his left ear on a table. Avulsion present to the left upper ear.

## 2015-11-03 NOTE — ED Notes (Signed)
Bed: WA08 Expected date:  Expected time:  Means of arrival:  Comments: Ems-82yo fall

## 2015-11-03 NOTE — ED Notes (Signed)
PA at bedside.

## 2015-12-20 DIAGNOSIS — N183 Chronic kidney disease, stage 3 (moderate): Secondary | ICD-10-CM | POA: Diagnosis not present

## 2015-12-20 DIAGNOSIS — G301 Alzheimer's disease with late onset: Secondary | ICD-10-CM | POA: Diagnosis not present

## 2015-12-20 DIAGNOSIS — I129 Hypertensive chronic kidney disease with stage 1 through stage 4 chronic kidney disease, or unspecified chronic kidney disease: Secondary | ICD-10-CM | POA: Diagnosis not present

## 2015-12-27 DIAGNOSIS — H25011 Cortical age-related cataract, right eye: Secondary | ICD-10-CM | POA: Diagnosis not present

## 2015-12-27 DIAGNOSIS — H401131 Primary open-angle glaucoma, bilateral, mild stage: Secondary | ICD-10-CM | POA: Diagnosis not present

## 2015-12-27 DIAGNOSIS — H2511 Age-related nuclear cataract, right eye: Secondary | ICD-10-CM | POA: Diagnosis not present

## 2016-01-12 ENCOUNTER — Ambulatory Visit (INDEPENDENT_AMBULATORY_CARE_PROVIDER_SITE_OTHER): Payer: Self-pay | Admitting: Neurology

## 2016-01-12 ENCOUNTER — Encounter: Payer: Self-pay | Admitting: Neurology

## 2016-01-12 VITALS — BP 96/50 | HR 40

## 2016-01-12 DIAGNOSIS — F0280 Dementia in other diseases classified elsewhere without behavioral disturbance: Secondary | ICD-10-CM

## 2016-01-12 DIAGNOSIS — I951 Orthostatic hypotension: Secondary | ICD-10-CM

## 2016-01-12 DIAGNOSIS — G309 Alzheimer's disease, unspecified: Secondary | ICD-10-CM

## 2016-01-12 DIAGNOSIS — F0150 Vascular dementia without behavioral disturbance: Secondary | ICD-10-CM | POA: Diagnosis not present

## 2016-01-12 NOTE — Progress Notes (Signed)
NEUROLOGY FOLLOW UP OFFICE NOTE  Willie Mayer [id number]  HISTORY OF PRESENT ILLNESS: Willie Mayer, Willie Mayer is an 82 year old right-handed man with history of dementia, stroke, DVT, hypertension, orthostatic hypotension and depression who follows up for Alzheimer's dementia.    UPDATE: He is taking galantamine.  He was taking 8mg  twice daily.  He moved to [facility] in April and for some reason has been taking only 4mg  twice daily.  Since moving to [facility], he is doing much better.  Once the staff is able to get him out of bed, he is pleasant.  He does nap in the chair at times.  Appetite is good.  Sleep is good, although he does have urinary incontinence.Marland Kitchen  HISTORY: Symptoms were first noted at least 2.5 years ago.  At first, he would often repeat questions.  He used to have a great sense of direction, however he began getting lost while driving.  His memory to detail used to be good.  He had an MRI of the brain performed around that time, which revealed a stroke, age indeterminant.  Over the next two years, memory had progressively worsened.  He had a neuropsychological evaluation performed on 02/12/13, which revealed impairment in multiple domains and was felt to be a neurodegenerative process, rather than a cerebrovascular event.  He no longer drives.  He no longer manages his finances alone.    Over the past year, he has had gradual increased generalized weakness.  He is only able to ambulate short distances before needing to stop.  He has had bowel incontinence as well as bladder incontinence.  He sleeps most of the day, sometimes difficult to arouse.  Otherwise, he uses the wheelchair.  He has been experiencing dyspnea on exertion.  He does have orthostatic hypotension.  He was admitted to [facility] on 06/22/15 for fatigue and possible syncope.  He was found to be bradycardic.  EKG revealed new left bundle branch block.  MRI of the brain revealed advanced chronic small vessel disease, including  remote left internal capsular stroke, but no acute stroke.  TSH was 1.726.  B12 was 496.  He once again demonstrated positive orthostatics and he was treated with IV fluids and compression stockings.  Namenda was discontinued as possible contributor to bradycardia.  Celexa was decreased to 20mg  daily.  Since discharge, he has been more irritable.  He doesn't eat much (one or two meals) and drinks a couple of Ensures a day.  He exhibits disinhibition and makes inappropriate remarks (including sexual).  He has not really exhibited any hallucinations or delusions. He has 24 hour nursing care.  He did not tolerate Aricept.  It made him fatigued, gave him GI distress, and caused aggressive behavior.  Namenda was discontinued due to potential contributor to bradycardia.  PAST MEDICAL HISTORY: Past Medical History  Diagnosis Date  . Dementia   . Hypertension   . History of CVA (cerebrovascular accident)   . History of DVT (deep vein thrombosis)   . History of pulmonary embolism   . Depression   . Glaucoma   . Urge incontinence   . BPH (benign prostatic hyperplasia)     s/p surgery that "didnt work"  . Stroke (HCC)   . Shortness of breath dyspnea   . New left bundle branch block (LBBB) 06/2015    MEDICATIONS: Current Outpatient Prescriptions on File Prior to Visit  Medication Sig Dispense Refill  . brimonidine (ALPHAGAN) 0.2 % ophthalmic solution Place 1 drop into  both eyes 3 (three) times daily. 5 mL 2  . brinzolamide (AZOPT) 1 % ophthalmic suspension Place 1 drop into the right eye 2 (two) times daily.    . clopidogrel (PLAVIX) 75 MG tablet Take 1 tablet (75 mg total) by mouth daily. 90 tablet 3  . galantamine (RAZADYNE) 8 MG tablet Take 1 tablet (8 mg total) by mouth 2 (two) times daily with a meal. (Patient taking differently: Take 4 mg by mouth 2 (two) times daily with a meal. ) 60 tablet 3  . mirabegron ER (MYRBETRIQ) 50 MG TB24 tablet Take 50 mg by mouth daily.     . pravastatin  (PRAVACHOL) 20 MG tablet TAKE ONE TABLET BY MOUTH ONCE DAILY (Patient taking differently: Take one tablet by mouth once daily for cholesterol at bedtime) 90 tablet 0  . tamsulosin (FLOMAX) 0.4 MG CAPS capsule Take 0.4 mg by mouth at bedtime.    . Vitamin D, Ergocalciferol, (DRISDOL) 50000 UNITS CAPS capsule Take 50,000 Units by mouth daily. Reported on 11/03/2015     No current facility-administered medications on file prior to visit.    ALLERGIES: Allergies  Allergen Reactions  . Aricept [Donepezil Hcl]     FAMILY HISTORY: Family History  Problem Relation Age of Onset  . Alcoholism Father     died in 36s related to complciations  . Heart failure Mother   . Arrhythmia Mother     SOCIAL HISTORY: Social History   Social History  . Marital Status: Married    Spouse Name: N/A  . Number of Children: N/A  . Years of Education: N/A   Occupational History  . Insurance     Social History Main Topics  . Smoking status: Former Smoker -- 15.00 packs/day    Quit date: 07/17/1965  . Smokeless tobacco: Never Used  . Alcohol Use: Yes     Comment: occasional gin and tonic  . Drug Use: No  . Sexual Activity: No   Other Topics Concern  . Not on file   Social History Narrative   Recently moved from [place] where he lived with his wife who has parkinsonian dementia. They were independent but moved down here after recent fall and knowledge of dementia. Live in independent living facility and indendent in [place]s.       Currently living at [facility] living. Lives with his wife. They help with medication delivery and activities of daily life.     REVIEW OF SYSTEMS: Constitutional: No fevers, chills, or sweats, no generalized fatigue, change in appetite Eyes: No visual changes, double vision, eye pain Ear, nose and throat: No hearing loss, ear pain, nasal congestion, sore throat Cardiovascular: No chest pain, palpitations Respiratory:  No shortness of breath at rest or with  exertion, wheezes GastrointestinaI: No nausea, vomiting, diarrhea, abdominal pain, fecal incontinence Genitourinary:  No dysuria, urinary retention or frequency Musculoskeletal:  No neck pain, back pain Integumentary: No rash, pruritus, skin lesions Neurological: as above Psychiatric: No depression, insomnia, anxiety Endocrine: No palpitations, fatigue, diaphoresis, mood swings, change in appetite, change in weight, increased thirst Hematologic/Lymphatic:  No purpura, petechiae. Allergic/Immunologic: no itchy/runny eyes, nasal congestion, recent allergic reactions, rashes  PHYSICAL EXAM: Filed Vitals:   01/12/16 1432  BP: 96/50  Pulse: 40   General: No acute distress.  Patient appears well-groomed.  normal body habitus. Head:  Normocephalic/atraumatic Eyes:  Fundi examined but not visualized Neck: supple, no paraspinal tenderness, full range of motion Heart:  Regular rate and rhythm Lungs:  Clear to  auscultation bilaterally Back: No paraspinal tenderness Neurological Exam: alert and oriented to person, season and month only. Attention span and concentration intact, recent and remote memory intact, fund of knowledge fair.  Speech fluent and not dysarthric, language intact.   MMSE - Mini Mental State Exam 01/12/2016 05/13/2014  Orientation to time 2 0  Orientation to Place 0 2  Registration 3 3  Attention/ Calculation 5 3  Recall 0 0  Language- name 2 objects 2 2  Language- repeat 1 1  Language- follow 3 step command 3 3  Language- read & follow direction 1 1  Write a sentence 1 1  Copy design 1 1  Total score 19 17   CN II-XII intact. Bulk and tone normal, muscle strength 5/5 throughout.  Sensation to light touch intact.  Deep tendon reflexes trace throughout.  Finger to nose and heel to shin testing intact.  Gait with steady brisk stride, Romberg negative.  IMPRESSION: Mixed vascular and Alzheimer's disease Cerebrovascular disease Orthostatic hypotension  PLAN: 1.   Increase galantamine to 8mg  twice daily for 4 weeks, then increase to 12mg  twice daily 2.  Plavix for secondary stroke prevention 3.  Statin therapy as per PCP (LDL goal should be less than 100) 4.  Monitor blood pressure 5.  Follow up in 6 months.  27 minutes spent face to face with patient, over 50% spent discussing management.  Shon Millet, DO  CC:  Merlene Laughter, MD

## 2016-01-12 NOTE — Progress Notes (Signed)
Chart forwarded.  

## 2016-01-12 NOTE — Patient Instructions (Signed)
1.  We will increase galantamine to 8mg  twice daily for 4 weeks and then increase to 12mg  twice daily if tolerating. 2.  Follow up in 6 months.

## 2016-01-19 DIAGNOSIS — H2511 Age-related nuclear cataract, right eye: Secondary | ICD-10-CM | POA: Diagnosis not present

## 2016-01-19 DIAGNOSIS — H25011 Cortical age-related cataract, right eye: Secondary | ICD-10-CM | POA: Diagnosis not present

## 2016-01-24 DIAGNOSIS — H2512 Age-related nuclear cataract, left eye: Secondary | ICD-10-CM | POA: Diagnosis not present

## 2016-01-24 DIAGNOSIS — H2701 Aphakia, right eye: Secondary | ICD-10-CM | POA: Diagnosis not present

## 2016-01-24 DIAGNOSIS — T8522XA Displacement of intraocular lens, initial encounter: Secondary | ICD-10-CM | POA: Diagnosis not present

## 2016-01-24 DIAGNOSIS — H4301 Vitreous prolapse, right eye: Secondary | ICD-10-CM | POA: Diagnosis not present

## 2016-01-26 DIAGNOSIS — G301 Alzheimer's disease with late onset: Secondary | ICD-10-CM | POA: Diagnosis not present

## 2016-01-26 DIAGNOSIS — I10 Essential (primary) hypertension: Secondary | ICD-10-CM | POA: Diagnosis not present

## 2016-02-02 DIAGNOSIS — H33321 Round hole, right eye: Secondary | ICD-10-CM | POA: Diagnosis not present

## 2016-02-02 DIAGNOSIS — T8522XA Displacement of intraocular lens, initial encounter: Secondary | ICD-10-CM | POA: Diagnosis not present

## 2016-02-02 DIAGNOSIS — H2701 Aphakia, right eye: Secondary | ICD-10-CM | POA: Diagnosis not present

## 2016-02-15 DIAGNOSIS — N3942 Incontinence without sensory awareness: Secondary | ICD-10-CM | POA: Diagnosis not present

## 2016-02-22 DIAGNOSIS — G309 Alzheimer's disease, unspecified: Secondary | ICD-10-CM | POA: Diagnosis not present

## 2016-02-22 DIAGNOSIS — R2689 Other abnormalities of gait and mobility: Secondary | ICD-10-CM | POA: Diagnosis not present

## 2016-02-22 DIAGNOSIS — M25561 Pain in right knee: Secondary | ICD-10-CM | POA: Diagnosis not present

## 2016-02-22 DIAGNOSIS — M06862 Other specified rheumatoid arthritis, left knee: Secondary | ICD-10-CM | POA: Diagnosis not present

## 2016-03-17 DIAGNOSIS — M25561 Pain in right knee: Secondary | ICD-10-CM | POA: Diagnosis not present

## 2016-03-17 DIAGNOSIS — R2689 Other abnormalities of gait and mobility: Secondary | ICD-10-CM | POA: Diagnosis not present

## 2016-03-17 DIAGNOSIS — M06862 Other specified rheumatoid arthritis, left knee: Secondary | ICD-10-CM | POA: Diagnosis not present

## 2016-03-17 DIAGNOSIS — G309 Alzheimer's disease, unspecified: Secondary | ICD-10-CM | POA: Diagnosis not present

## 2016-04-19 DIAGNOSIS — L723 Sebaceous cyst: Secondary | ICD-10-CM | POA: Diagnosis not present

## 2016-06-22 DIAGNOSIS — G301 Alzheimer's disease with late onset: Secondary | ICD-10-CM | POA: Diagnosis not present

## 2016-06-22 DIAGNOSIS — E7800 Pure hypercholesterolemia, unspecified: Secondary | ICD-10-CM | POA: Diagnosis not present

## 2016-07-06 DIAGNOSIS — R040 Epistaxis: Secondary | ICD-10-CM | POA: Diagnosis not present

## 2016-07-14 ENCOUNTER — Encounter: Payer: Self-pay | Admitting: Neurology

## 2016-07-14 ENCOUNTER — Ambulatory Visit (INDEPENDENT_AMBULATORY_CARE_PROVIDER_SITE_OTHER): Payer: PPO | Admitting: Neurology

## 2016-07-14 VITALS — BP 114/68 | HR 36

## 2016-07-14 DIAGNOSIS — G309 Alzheimer's disease, unspecified: Secondary | ICD-10-CM

## 2016-07-14 DIAGNOSIS — F0280 Dementia in other diseases classified elsewhere without behavioral disturbance: Secondary | ICD-10-CM

## 2016-07-14 DIAGNOSIS — F0150 Vascular dementia without behavioral disturbance: Secondary | ICD-10-CM | POA: Diagnosis not present

## 2016-07-14 NOTE — Progress Notes (Signed)
NEUROLOGY FOLLOW UP OFFICE NOTE  Willie Jerseyhomas H Artz Willie Mayer. [id number]  HISTORY OF PRESENT ILLNESS: Willie Mayer, Willie Mayer is an 82 year old right-handed man with history of dementia, stroke, DVT, hypertension, orthostatic hypotension and depression who follows up for Alzheimer's dementia.  He is accompanied by his daughter who provides updated history as patient unable to contribute due to his disease.   UPDATE: He lives at [facility],  He is taking galantamine 12mg  twice daily. He usually sits in his room watching TV most of the day.  He chooses to eat in his room.  He does not want to go out.  He sleeps well but naps often during the day.  He is not agitated.  His daughter visits him every other day.  He does not speak much unless he is engaged by others.   HISTORY: Symptoms were first noted at least 3 years ago.  At first, he would often repeat questions.  He used to have a great sense of direction, however he began getting lost while driving.  His memory to detail used to be good.  He had an MRI of the brain performed around that time, which revealed a stroke, age indeterminant.  Over the next two years, memory had progressively worsened.  He had a neuropsychological evaluation performed on 02/12/13, which revealed impairment in multiple domains and was felt to be a neurodegenerative process, rather than a cerebrovascular event.  He no longer drives.  He no longer manages his finances alone.     H has had gradual increased generalized weakness.  He is only able to ambulate short distances before needing to stop.  He has had bowel incontinence as well as bladder incontinence.  He sleeps most of the day, sometimes difficult to arouse.  Otherwise, he uses the wheelchair.  He has been experiencing dyspnea on exertion.  He does have orthostatic hypotension.  He was admitted to [facility] on 06/22/15 for fatigue and possible syncope.  He was found to be bradycardic.  EKG revealed new left bundle branch block.  MRI  of the brain revealed advanced chronic small vessel disease, including remote left internal capsular stroke, but no acute stroke.  TSH was 1.726.  B12 was 496.  He once again demonstrated positive orthostatics and he was treated with IV fluids and compression stockings.  Namenda was discontinued as possible contributor to bradycardia.  Celexa was decreased to 20mg  daily.  Since discharge, he has been more irritable.  He doesn't eat much (one or two meals) and drinks a couple of Ensures a day.  He exhibits disinhibition and makes inappropriate remarks (including sexual).  He has not really exhibited any hallucinations or delusions. He has 24 hour nursing care.   He did not tolerate Aricept.  It made him fatigued, gave him GI distress, and caused aggressive behavior.  Namenda was discontinued due to potential contributor to bradycardia.  PAST MEDICAL HISTORY: Past Medical History:  Diagnosis Date  . BPH (benign prostatic hyperplasia)    s/p surgery that "didnt work"  . Dementia   . Depression   . Glaucoma   . History of CVA (cerebrovascular accident)   . History of DVT (deep vein thrombosis)   . History of pulmonary embolism   . Hypertension   . New left bundle branch block (LBBB) 06/2015  . Shortness of breath dyspnea   . Stroke (HCC)   . Urge incontinence     MEDICATIONS: Current Outpatient Prescriptions on File Prior to Visit  Medication Sig Dispense Refill  . alendronate (FOSAMAX) 40 MG tablet Take 40 mg by mouth every 7 (seven) days. Take with a full glass of water on an empty stomach.    . clopidogrel (PLAVIX) 75 MG tablet Take 1 tablet (75 mg total) by mouth daily. 90 tablet 3  . pravastatin (PRAVACHOL) 20 MG tablet TAKE ONE TABLET BY MOUTH ONCE DAILY (Patient taking differently: Take one tablet by mouth once daily for cholesterol at bedtime) 90 tablet 0  . tamsulosin (FLOMAX) 0.4 MG CAPS capsule Take 0.4 mg by mouth at bedtime.     No current facility-administered medications on  file prior to visit.     ALLERGIES: Allergies  Allergen Reactions  . Aricept [Donepezil Hcl]     FAMILY HISTORY: Family History  Problem Relation Age of Onset  . Alcoholism Father     died in 50s related to complciations  . Heart failure Mother   . Arrhythmia Mother     SOCIAL HISTORY: Social History   Social History  . Marital status: Married    Spouse name: N/A  . Number of children: N/A  . Years of education: N/A   Occupational History  . Insurance     Social History Main Topics  . Smoking status: Former Smoker    Packs/day: 15.00    Quit date: 07/17/1965  . Smokeless tobacco: Never Used  . Alcohol use Yes     Comment: occasional gin and tonic  . Drug use: No  . Sexual activity: No   Other Topics Concern  . Not on file   Social History Narrative   Recently moved from [place] where he lived with his wife who has parkinsonian dementia. They were independent but moved down here after recent fall and knowledge of dementia. Live in independent living facility and indendent in ADLs.       Currently living at [facility] living. Lives with his wife. They help with medication delivery and activities of daily life.     REVIEW OF SYSTEMS: Constitutional: No fevers, chills, or sweats, no generalized fatigue, change in appetite Eyes: No visual changes, double vision, eye pain Ear, nose and throat: No hearing loss, ear pain, nasal congestion, sore throat Cardiovascular: No chest pain, palpitations Respiratory:  No shortness of breath at rest or with exertion, wheezes GastrointestinaI: No nausea, vomiting, diarrhea, abdominal pain, fecal incontinence Genitourinary:  No dysuria, urinary retention or frequency Musculoskeletal:  No neck pain, back pain Integumentary: No rash, pruritus, skin lesions Neurological: as above Psychiatric: No depression, insomnia, anxiety Endocrine: No palpitations, fatigue, diaphoresis, mood swings, change in appetite, change in weight,  increased thirst Hematologic/Lymphatic:  No purpura, petechiae. Allergic/Immunologic: no itchy/runny eyes, nasal congestion, recent allergic reactions, rashes  PHYSICAL EXAM: Vitals:   07/14/16 1422  BP: 114/68  Pulse: (!) 36   General: No acute distress.  Head:  Normocephalic/atraumatic Eyes:  Fundi examined but not visualized Neck: supple, no paraspinal tenderness, full range of motion Heart:  Regular rate and rhythm Lungs:  Clear to auscultation bilaterally Back: No paraspinal tenderness Neurological Exam: alert and oriented to person, place, and year, but not month (did not know it was just after Christmas, thought it was July). Attention span and concentration impaired, delayed recall poor, memory intact, fund of knowledge impaired (does not know the president) Speech fluent and not dysarthric, language intact.  CN II-XII intact. Bulk and tone normal, muscle strength 5/5 throughout.  Sensation to light touch  intact.  Deep tendon reflexes 2+  throughout.  Finger to nose testing intact.  Gait normal  IMPRESSION: Mixed Alzheimer and vascular dementia  PLAN: 1.  Galantamine 12mg  twice daily 2.  Recommend social interaction.  It would be nice if somebody can sit with him more often to engage in conversation. 4.  Follow up in one year.  27 minutes spent face to face with patient, over 50% spent discussing prognosis and need for social interaction.  Shon Millet, DO  CC:  Merlene Laughter, MD

## 2016-07-14 NOTE — Progress Notes (Signed)
Note sent to Dr. Pete Glatter.

## 2016-07-14 NOTE — Patient Instructions (Signed)
Continue galantamine 12mg  twice daily Would recommend that he have somebody with him to keep him company and engage him in conversation. Follow up in one year.

## 2016-08-09 DIAGNOSIS — Z9181 History of falling: Secondary | ICD-10-CM | POA: Diagnosis not present

## 2016-08-09 DIAGNOSIS — R2689 Other abnormalities of gait and mobility: Secondary | ICD-10-CM | POA: Diagnosis not present

## 2016-08-18 DIAGNOSIS — G301 Alzheimer's disease with late onset: Secondary | ICD-10-CM | POA: Diagnosis not present

## 2016-08-19 DIAGNOSIS — R2689 Other abnormalities of gait and mobility: Secondary | ICD-10-CM | POA: Diagnosis not present

## 2016-08-19 DIAGNOSIS — Z9181 History of falling: Secondary | ICD-10-CM | POA: Diagnosis not present

## 2016-08-24 DIAGNOSIS — H401131 Primary open-angle glaucoma, bilateral, mild stage: Secondary | ICD-10-CM | POA: Diagnosis not present

## 2016-08-24 DIAGNOSIS — H2512 Age-related nuclear cataract, left eye: Secondary | ICD-10-CM | POA: Diagnosis not present

## 2016-08-24 DIAGNOSIS — Z961 Presence of intraocular lens: Secondary | ICD-10-CM | POA: Diagnosis not present

## 2016-09-05 ENCOUNTER — Emergency Department (HOSPITAL_COMMUNITY)
Admission: EM | Admit: 2016-09-05 | Discharge: 2016-09-06 | Disposition: A | Discharge status: Home / Self Care | Payer: PPO | Attending: Emergency Medicine | Admitting: Emergency Medicine

## 2016-09-05 ENCOUNTER — Encounter (HOSPITAL_COMMUNITY): Payer: Self-pay | Admitting: Emergency Medicine

## 2016-09-05 ENCOUNTER — Emergency Department (HOSPITAL_COMMUNITY): Payer: PPO

## 2016-09-05 DIAGNOSIS — N183 Chronic kidney disease, stage 3 (moderate): Secondary | ICD-10-CM | POA: Diagnosis not present

## 2016-09-05 DIAGNOSIS — I517 Cardiomegaly: Secondary | ICD-10-CM | POA: Diagnosis not present

## 2016-09-05 DIAGNOSIS — I129 Hypertensive chronic kidney disease with stage 1 through stage 4 chronic kidney disease, or unspecified chronic kidney disease: Secondary | ICD-10-CM | POA: Diagnosis not present

## 2016-09-05 DIAGNOSIS — Z87891 Personal history of nicotine dependence: Secondary | ICD-10-CM | POA: Diagnosis not present

## 2016-09-05 DIAGNOSIS — G309 Alzheimer's disease, unspecified: Secondary | ICD-10-CM | POA: Diagnosis not present

## 2016-09-05 DIAGNOSIS — R531 Weakness: Secondary | ICD-10-CM | POA: Diagnosis not present

## 2016-09-05 DIAGNOSIS — Z8673 Personal history of transient ischemic attack (TIA), and cerebral infarction without residual deficits: Secondary | ICD-10-CM | POA: Diagnosis not present

## 2016-09-05 DIAGNOSIS — Z79899 Other long term (current) drug therapy: Secondary | ICD-10-CM | POA: Insufficient documentation

## 2016-09-05 DIAGNOSIS — R404 Transient alteration of awareness: Secondary | ICD-10-CM | POA: Diagnosis not present

## 2016-09-05 DIAGNOSIS — I442 Atrioventricular block, complete: Secondary | ICD-10-CM | POA: Insufficient documentation

## 2016-09-05 DIAGNOSIS — R0689 Other abnormalities of breathing: Secondary | ICD-10-CM | POA: Insufficient documentation

## 2016-09-05 DIAGNOSIS — R001 Bradycardia, unspecified: Secondary | ICD-10-CM | POA: Diagnosis not present

## 2016-09-05 DIAGNOSIS — Z96653 Presence of artificial knee joint, bilateral: Secondary | ICD-10-CM | POA: Diagnosis not present

## 2016-09-05 LAB — I-STAT CHEM 8, ED
BUN: 17 mg/dL (ref 6–20)
CALCIUM ION: 1.15 mmol/L (ref 1.15–1.40)
CHLORIDE: 108 mmol/L (ref 101–111)
Creatinine, Ser: 1.6 mg/dL — ABNORMAL HIGH (ref 0.61–1.24)
Glucose, Bld: 121 mg/dL — ABNORMAL HIGH (ref 65–99)
HCT: 41 % (ref 39.0–52.0)
Hemoglobin: 13.9 g/dL (ref 13.0–17.0)
Potassium: 4.3 mmol/L (ref 3.5–5.1)
Sodium: 145 mmol/L (ref 135–145)
TCO2: 24 mmol/L (ref 0–100)

## 2016-09-05 LAB — I-STAT TROPONIN, ED: Troponin i, poc: 0.05 ng/mL (ref 0.00–0.08)

## 2016-09-05 LAB — CBC WITH DIFFERENTIAL/PLATELET
Basophils Absolute: 0 10*3/uL (ref 0.0–0.1)
Basophils Relative: 1 %
Eosinophils Absolute: 0.2 10*3/uL (ref 0.0–0.7)
Eosinophils Relative: 2 %
HEMATOCRIT: 41.5 % (ref 39.0–52.0)
HEMOGLOBIN: 13.3 g/dL (ref 13.0–17.0)
LYMPHS ABS: 1.6 10*3/uL (ref 0.7–4.0)
LYMPHS PCT: 21 %
MCH: 29.1 pg (ref 26.0–34.0)
MCHC: 32 g/dL (ref 30.0–36.0)
MCV: 90.8 fL (ref 78.0–100.0)
Monocytes Absolute: 0.5 10*3/uL (ref 0.1–1.0)
Monocytes Relative: 6 %
Neutro Abs: 5.4 10*3/uL (ref 1.7–7.7)
Neutrophils Relative %: 70 %
Platelets: 179 10*3/uL (ref 150–400)
RBC: 4.57 MIL/uL (ref 4.22–5.81)
RDW: 14.7 % (ref 11.5–15.5)
WBC: 7.7 10*3/uL (ref 4.0–10.5)

## 2016-09-05 LAB — BRAIN NATRIURETIC PEPTIDE: B Natriuretic Peptide: 658.6 pg/mL — ABNORMAL HIGH (ref 0.0–100.0)

## 2016-09-05 MED ORDER — ASPIRIN 81 MG PO CHEW: 324.0000 mg | CHEWABLE_TABLET | Freq: Once | ORAL | Status: DC

## 2016-09-05 NOTE — Discharge Instructions (Signed)
Please consult hospice to initiate services.

## 2016-09-05 NOTE — Progress Notes (Signed)
Palliative Medicine RN Note: Met with patient, who has dementia, and with daughter Levander Campion. They both state they want pt to return to [facility]) ASAP. Met with [employer] She reports pt has had HR in 20s since January and that she has already requested hospice at [facility]; he is private pay there and not in skilled days. He shares a room with his wife. Levander Campion recognizes that end of life is near, and she would like pt to remain comfortable and at home. Discussed options of admission for hospice as noted by ED MD vs inpt hospice vs [facility] hospice; she prefers return to [facility] ASAP w hospice.   I called and spoke with the BSN at [facility] [phone]); explained situation and that MDs recommend hospice and family wants it there instead of going to inpt hospice. Discussed their process for enrolling pt in hospice; our d/c instructions need to have hospice ordered, then their MD will give order. She states that they should be able to get "the form" to hospice today for an eval by [facility] tomorrow. Explained the importance of getting this done asap; she verbalized understanding and reports that it will be done.   Discussed d/c plan with daughter. Also supported her kind and loving decision to support her father's wishes to stay comfortable and not prolong death. She explained that if he didn't have dementia, she would likely pursue PM, but she doesn't want him to be "a vegetable" in the bed; this is a very selfless decision on her part; offered to speak to her brothers to explain, but she states they are all in agreement.   Spoke with ED MD, who will initate d/c via [facility]. Levander Campion has my number in case of questions/concerns. I contacted Stacie w [facility] to ensure they know about referral and will follow up tomorrow.  Marjie Skiff Virgie Chery, RN, BSN, [facility]  3:57 PM Cell [phone] 8:00-4:00 Monday-Friday Office [phone]

## 2016-09-05 NOTE — ED Notes (Signed)
Pt's daughter notified-- Georgina Quint -- is on her way.

## 2016-09-05 NOTE — ED Triage Notes (Addendum)
Pt to ED via [facility] with bradycardia-- lives at [facility], hx of dementia. Staff stated pt's heart rate was low-- on EMS arrival, pulse= 20's, but sinus bradycardia.

## 2016-09-05 NOTE — ED Provider Notes (Signed)
[facility]-EMERGENCY DEPT Provider Note   CSN: [id number] Arrival date & time:   1005     History   Chief Complaint Chief Complaint  Patient presents with  . Bradycardia    HPI Willie Mayer. is a 83 y.o. male.  HPI   Presents after nursing home checked his VS and found HR to be 30.  Pt states asx. Pt poor historian as has dementia, which is at baseline. Denies CP/SOB, lightheadedness. EMS gave 500cc of fluids, no hypotension. Not on bb. Review of records show pt has had in 1993 - never any pacemaker.   Bradycardia with New left bundle branch block (LBBB): Acute. Findings on EKG previously not present. Previous EKG showed a right bundle branch block. Last echocardiogram on 02/19/2015 showed EF of 60-65%. He was admitted to telemetry bed, Consulted EP, recommended for optimization of orthostatic hypotension, no indication for pacing at this time for the Mobitz 1 second degree heart block.   Spoke with daughter, Verna Czech [phone], states DNR/DNI, no pacemaker now but will allow temporary pacing externally while they arrive to discuss with rest of family over permanent pacemaker.  Past Medical History:  Diagnosis Date  . BPH (benign prostatic hyperplasia)    s/p surgery that "didnt work"  . Dementia   . Depression   . Glaucoma   . History of CVA (cerebrovascular accident)   . History of DVT (deep vein thrombosis)   . History of pulmonary embolism   . Hypertension   . New left bundle branch block (LBBB) 06/2015  . Shortness of breath dyspnea   . Stroke (HCC)   . Urge incontinence     Patient Active Problem List   Diagnosis Date Noted  . BPH (benign prostatic hyperplasia) 08/02/2015  . New left bundle branch block (LBBB) 06/23/2015  . Hyperlipidemia 06/23/2015  . Chronic kidney disease, stage III (moderate) 06/23/2015  . Fatigue 06/23/2015  . Orthostatic hypotension   . Second degree AV block, Mobitz type I   . Bradycardia 06/22/2015  . RBBB (right  bundle branch block with left anterior fascicular block) 02/20/2015  . Preoperative clearance 02/20/2015  . Hip fracture (HCC) 02/19/2015  . Alzheimer's disease 11/12/2013  . Skin lesion of face 05/30/2013  . Orthostasis 05/07/2013  . Dyspnea 05/07/2013  . Unspecified vitamin D deficiency 04/24/2013  . Other malaise and fatigue 04/23/2013  . Hearing loss of both ears 04/23/2013  . Urinary incontinence 04/23/2013  . Lumbago 03/23/2013  . Syncope 03/23/2013  . Dementia with behavioral disturbance   . Hypertension   . History of CVA (cerebrovascular accident)   . History of pulmonary embolism   . Depression   . Glaucoma     Past Surgical History:  Procedure Laterality Date  . bph surgery    . INTRAMEDULLARY (IM) NAIL INTERTROCHANTERIC Left 02/20/2015   Procedure: LEFT  INTERTROCHANTRIC HIP FRACTURE;  Surgeon: Eldred Manges, MD;  Location: [facility];  Service: Orthopedics;  Laterality: Left;  . JOINT REPLACEMENT    . REPLACEMENT TOTAL KNEE BILATERAL         Home Medications    Prior to Admission medications   Medication Sig Start Date End Date Taking? Authorizing Provider  alendronate (FOSAMAX) 40 MG tablet Take 40 mg by mouth every 7 (seven) days. Take with a full glass of water on an empty stomach.    Historical Provider, MD  Calcium Carb-Cholecalciferol (CALCIUM 500 +D PO) Take by mouth.    Historical Provider, MD  cholecalciferol (VITAMIN  D) 1000 units tablet Take 2,000 Units by mouth daily.    Historical Provider, MD  clopidogrel (PLAVIX) 75 MG tablet Take 1 tablet (75 mg total) by mouth daily. 08/14/13   Myra Rude, MD  galantamine (RAZADYNE) 12 MG tablet Take 12 mg by mouth 2 (two) times daily. 07/11/16   Historical Provider, MD  Multiple Vitamin (MULTIVITAMIN) tablet Take 1 tablet by mouth daily.    Historical Provider, MD  pravastatin (PRAVACHOL) 20 MG tablet TAKE ONE TABLET BY MOUTH ONCE DAILY Patient taking differently: Take one tablet by mouth once daily for  cholesterol at bedtime 06/30/14   Myra Rude, MD  tamsulosin (FLOMAX) 0.4 MG CAPS capsule Take 0.4 mg by mouth at bedtime.    Historical Provider, MD    Family History Family History  Problem Relation Age of Onset  . Alcoholism Father     died in 16s related to complciations  . Heart failure Mother   . Arrhythmia Mother     Social History Social History  Substance Use Topics  . Smoking status: Former Smoker    Packs/day: 15.00    Quit date: 07/17/1965  . Smokeless tobacco: Never Used  . Alcohol use Yes     Comment: occasional gin and tonic     Allergies   Aricept [donepezil hcl]   Review of Systems Review of Systems  Constitutional: Negative for fever.  Allergic/Immunologic: Negative for immunocompromised state.  All other systems reviewed and are negative.    Physical Exam Updated Vital Signs BP 122/56 (BP Location: Right Arm)   Pulse (!) 25   Temp 97.5 F (36.4 C) (Oral)   Resp 14   Ht 6\' 2"  (1.88 m)   Wt 104.3 kg   SpO2 97%   BMI 29.53 kg/m   Physical Exam  Constitutional: He appears well-developed and well-nourished. No distress.  HENT:  Head: Normocephalic and atraumatic.  Left Ear: External ear normal.  Eyes: Conjunctivae are normal. Pupils are equal, round, and reactive to light. Right eye exhibits no discharge. Left eye exhibits no discharge.  Neck: Normal range of motion. Neck supple.  Cardiovascular: Regular rhythm.   No murmur heard. HR in 20s-30s  Pulmonary/Chest: Effort normal and breath sounds normal. No respiratory distress.  Abdominal: Soft. Bowel sounds are normal. He exhibits no distension and no mass. There is no tenderness. There is no rebound and no guarding.  Musculoskeletal: He exhibits edema (3+ bilateral, symmetrical).  Neurological: He is alert.  confused  Skin: Skin is warm. Capillary refill takes less than 2 seconds. He is not diaphoretic.  Psychiatric: He has a normal mood and affect.  Nursing note and vitals  reviewed.  ED Treatments / Results  Labs (all labs ordered are listed, but only abnormal results are displayed) Labs Reviewed  BRAIN NATRIURETIC PEPTIDE - Abnormal; Notable for the following:       Result Value   B Natriuretic Peptide 658.6 (*)    All other components within normal limits  I-STAT CHEM 8, ED - Abnormal; Notable for the following:    Creatinine, Ser 1.60 (*)    Glucose, Bld 121 (*)    All other components within normal limits  CBC WITH DIFFERENTIAL/PLATELET  I-STAT TROPOININ, ED    EKG  EKG Interpretation  Date/Time:  Tuesday September 05 2016 10:18:36 EST Ventricular Rate:  88 PR Interval:    QRS Duration: 187 QT Interval:  650 QTC Calculation: 475 R Axis:   -109 Text Interpretation:  3rd degree heart  block with multiple PVC's RBBB and LAFB Confirmed by [facility] MD, JASON [phone]) on  11:54:09 AM       Radiology Dg Chest Portable 1 View  Result Date:  CLINICAL DATA:  Bradycardia EXAM: PORTABLE CHEST 1 VIEW COMPARISON:  06/22/2015 FINDINGS: Cardiomegaly is noted. Central mild vascular congestion. Mild perihilar interstitial prominence suspicious for mild interstitial edema. No segmental infiltrate. IMPRESSION: Cardiomegaly. Central mild vascular congestion. Mild perihilar interstitial prominence suspicious for mild interstitial edema. No segmental infiltrate. Electronically Signed   By: Natasha Mead M.D.   On:  10:33    Procedures Procedures (including critical care time)  Medications Ordered in ED Medications  aspirin chewable tablet 324 mg (not administered)     Initial Impression / Assessment and Plan / ED Course  I have reviewed the triage vital signs and the nursing notes.  Pertinent labs & imaging results that were available during my care of the patient were reviewed by me and considered in my medical decision making (see chart for details).     Third degree heart block on EKG, HDS otherwise. Pads placed, goals of  care clarified. Family would like to further discuss regarding pacemaker. Discussed with cardiology who only feels involvement in necessary if pacemaker wanted. Trop negative, Cr elevated from baseline (2016 so unclear how recent). Electrolytes acceptable, no CP/SOB. Family understands that pt may and likely will deteriorate and die if no intervention made. Pending their decision.  12:49p - discussed at length with daughter, POA, accompanied by Clydie Braun, Charity fundraiser. Family understands that pt is in non-sustainable heart rhythm that will likely lead to death and that his lungs are starting to back up with fluid, and that his kidneys are starting to shut down. They understand implications and would like to pursue hospice. Pt will be admitted for hospice.  Final Clinical Impressions(s) / ED Diagnoses   Final diagnoses:  Complete heart block [facility])    New Prescriptions New Prescriptions   No medications on file     Sidney Ace, MD  [phone]    Marily Memos, MD 09/06/16 1200

## 2016-09-05 NOTE — Discharge Planning (Signed)
EDCM consulted to assist with home hospice options.  [facility] advised ED Resident  to consult Palliative Care in this matter.

## 2016-09-14 DEATH — deceased

## 2016-12-24 IMAGING — CR DG CHEST 1V PORT
1 series · 1 of 1 positions shown · non-contrast
Comparison: January 04, 2015

CLINICAL DATA: Pain following fall. Intermittent cough and
shortness of breath

EXAM:
PORTABLE CHEST - 1 VIEW

[AP]
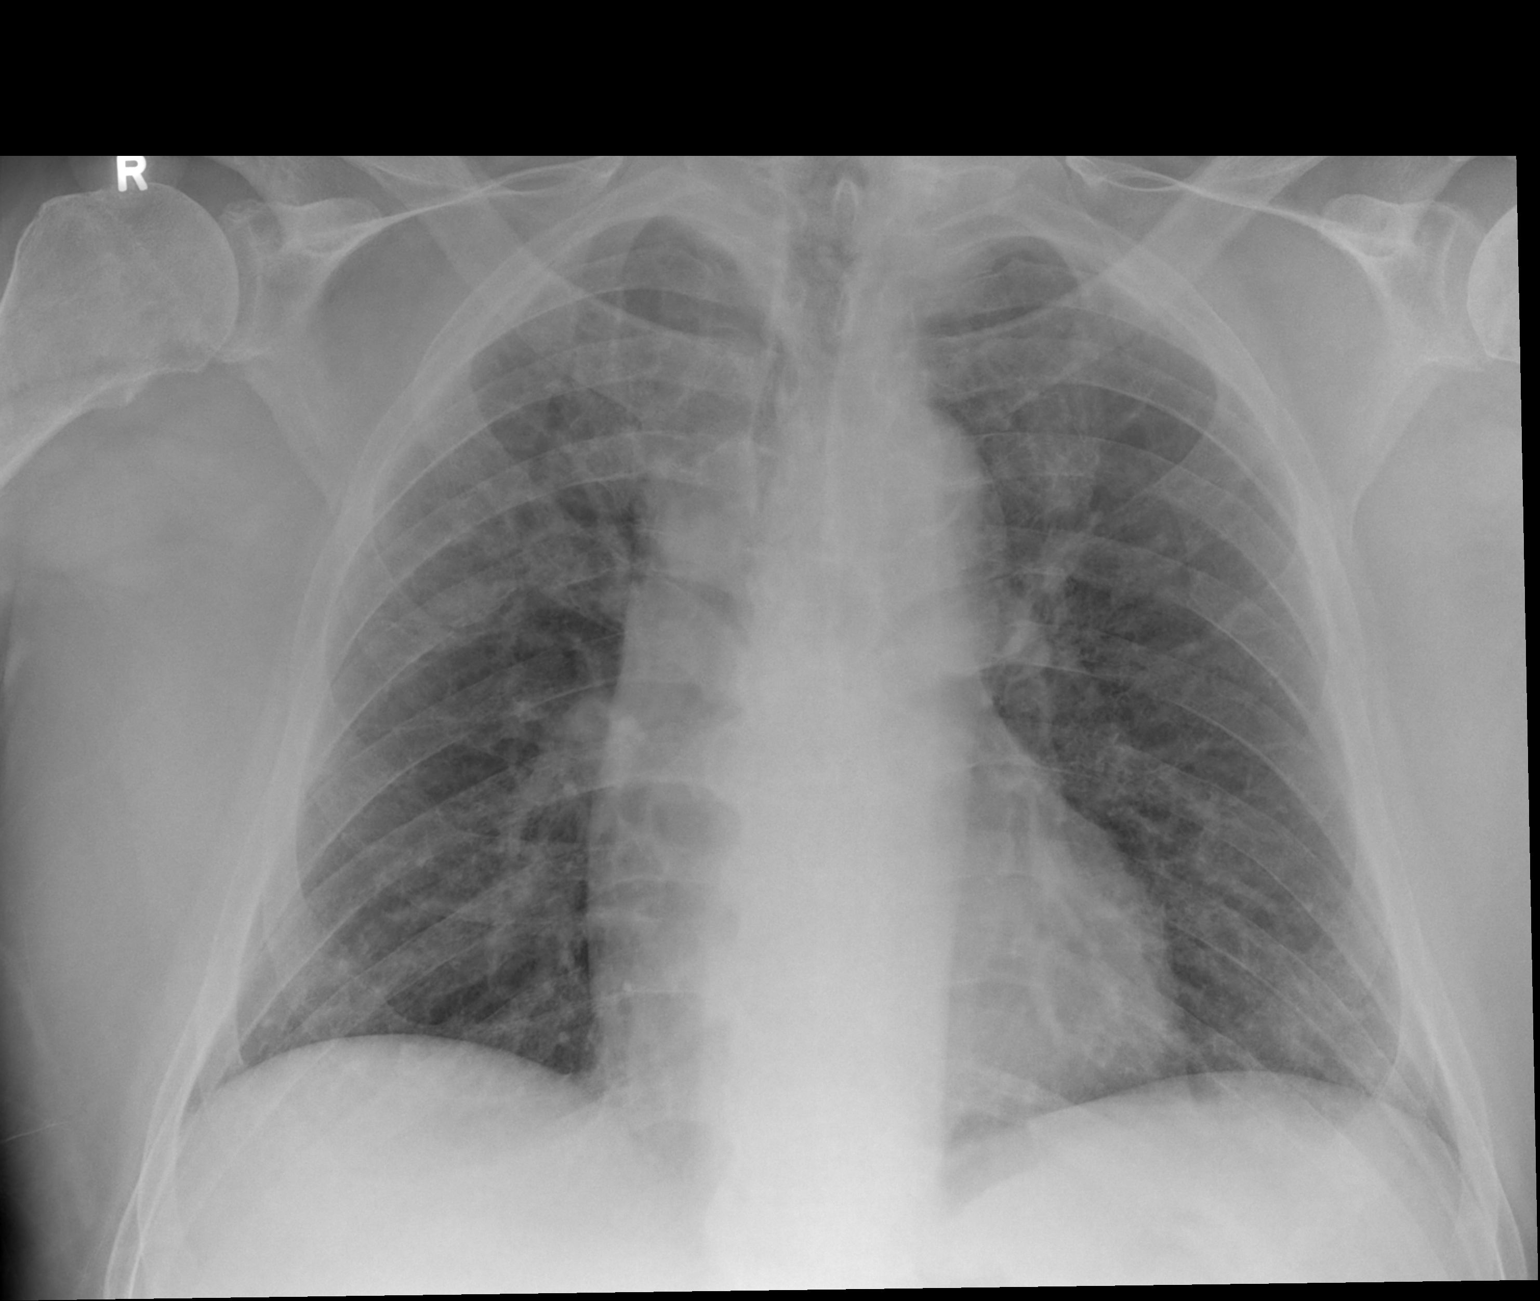

[1 of 1 positions shown; findings below may reference images not displayed]

FINDINGS: There is no edema or consolidation. The heart size and pulmonary
vascularity are normal. No pneumothorax. No adenopathy. No bone
lesions.
IMPRESSION: No edema or consolidation.

## 2017-04-26 IMAGING — CR DG CHEST 1V PORT
1 series · 1 of 1 positions shown · non-contrast
Comparison: 02/19/2015

CLINICAL DATA: Cough.  Dementia.

EXAM:
PORTABLE CHEST 1 VIEW

[AP]
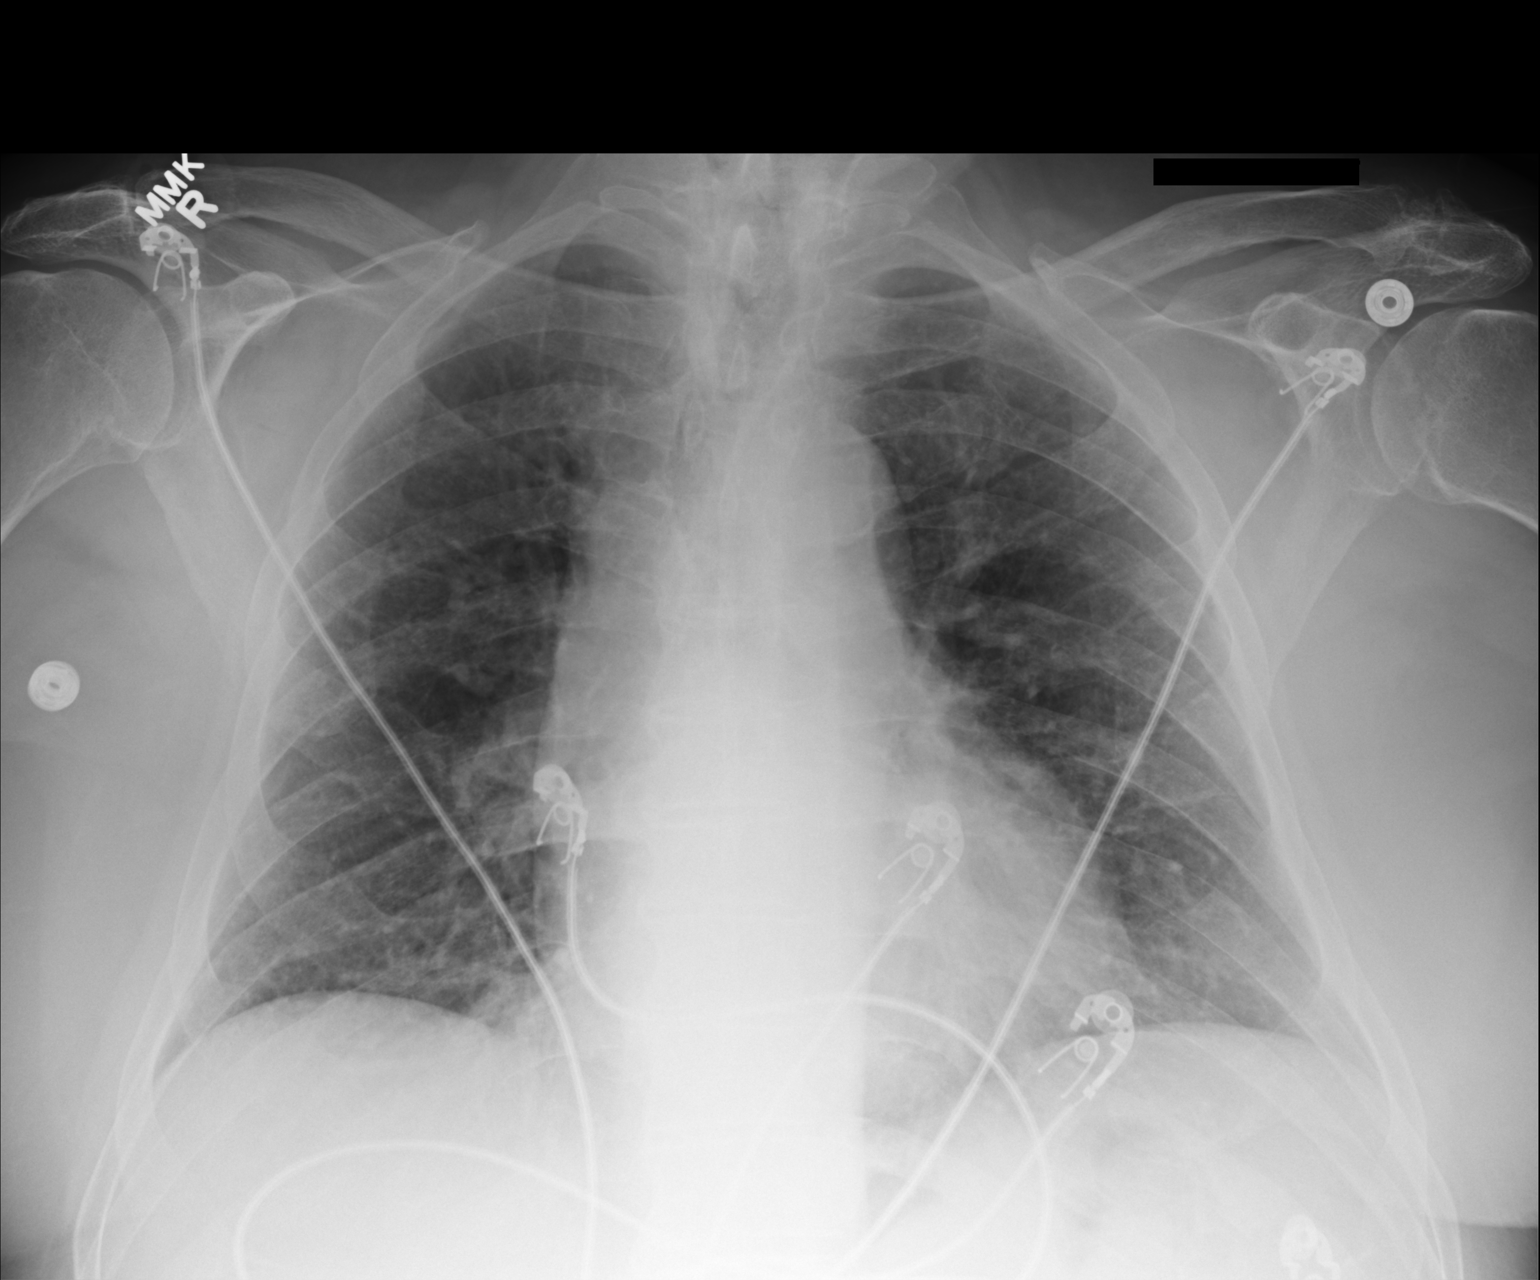

[1 of 1 positions shown; findings below may reference images not displayed]

FINDINGS: No cardiomegaly.  Stable normal aortic contour.  Negative hila.

Chronic mild interstitial coarsening without focal pneumonia or
edema. No effusion or pneumothorax.
IMPRESSION: Stable.  No evidence of acute cardiopulmonary disease.

## 2017-07-20 ENCOUNTER — Ambulatory Visit: Payer: PPO | Admitting: Neurology

## 2017-09-07 IMAGING — CT CT HEAD W/O CM
2 series · 16 of 30 positions shown, 19 images · non-contrast
Comparison: 06/23/2015

CLINICAL DATA: Fall, initially not verbally response of after the
fall. Struck left side of head on table.

EXAM:
CT HEAD WITHOUT CONTRAST
TECHNIQUE: Contiguous axial images were obtained from the base of the skull
through the vertex without intravenous contrast.

[Series 2: head w/o · axial · non-contrast · 0.42mm/px · z∈[-156,-26]mm · 9 of 34 slices shown, 12 images]
[im 4/34  brain]
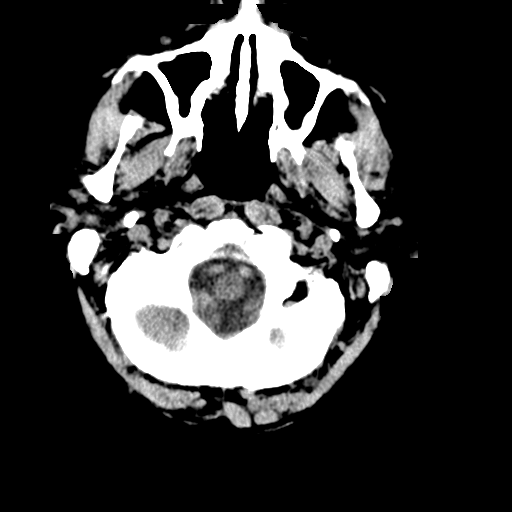
[im 4/34  bone]
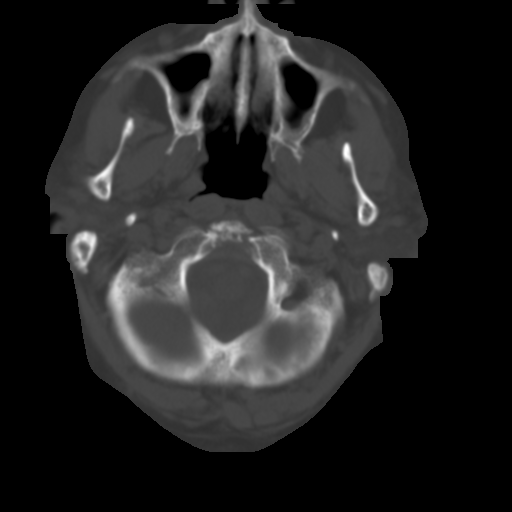
[im 7/34  brain]
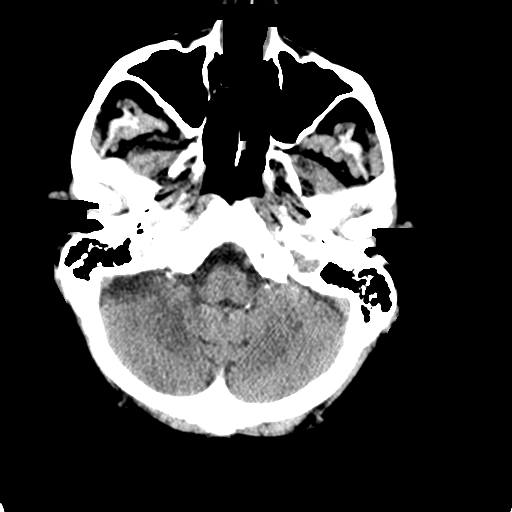
[im 10/34  brain]
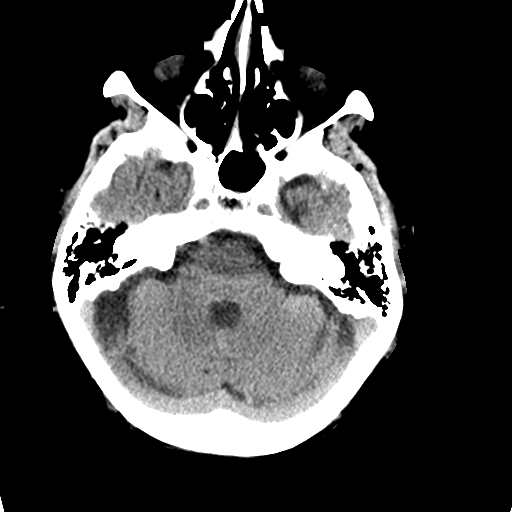
[im 14/34  brain]
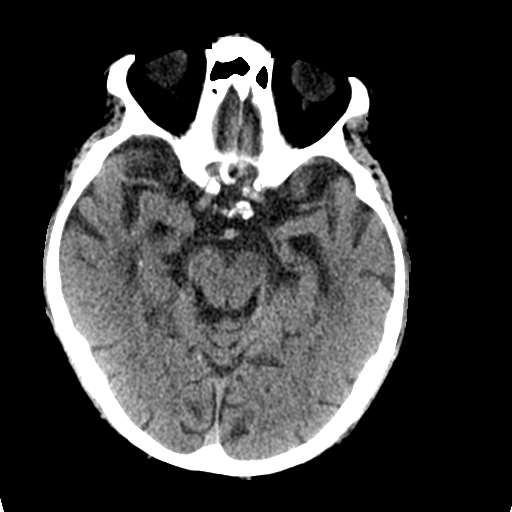
[im 17/34  brain]
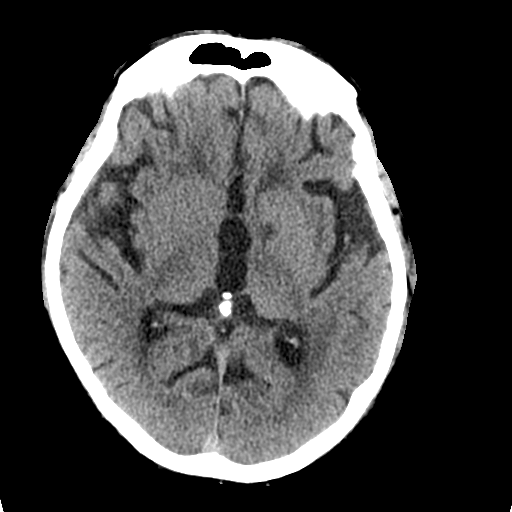
[im 17/34  bone]
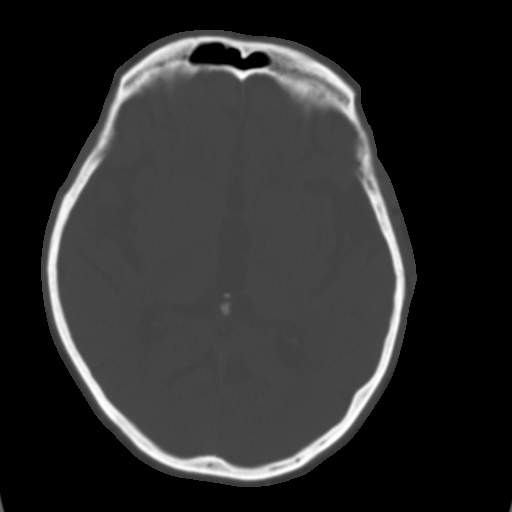
[im 20/34  brain]
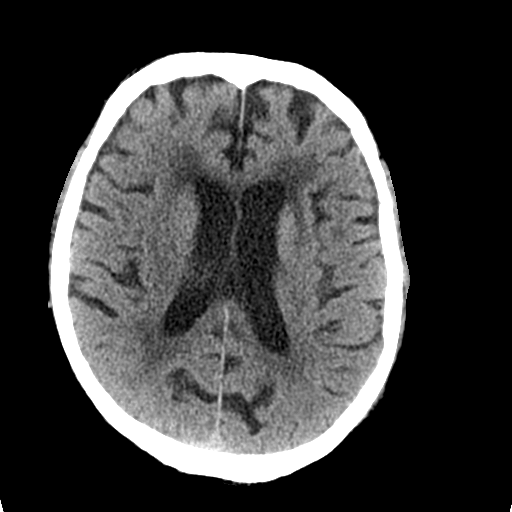
[im 24/34  brain]
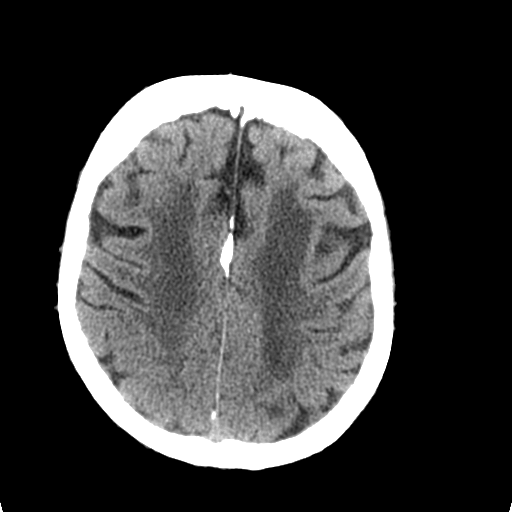
[im 27/34  brain]
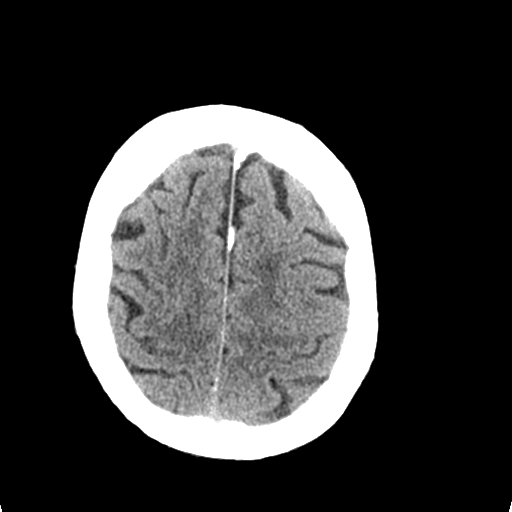
[im 30/34  brain]
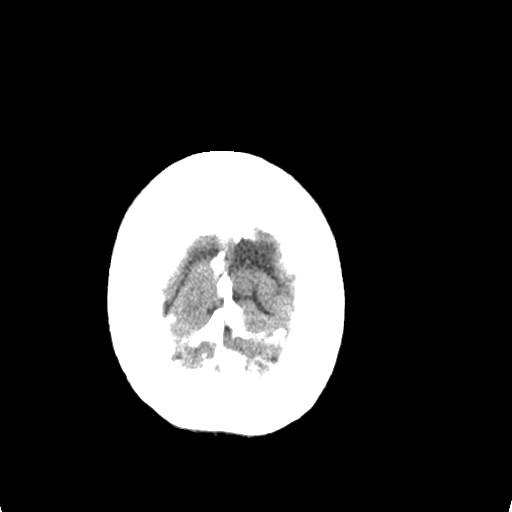
[im 30/34  bone]
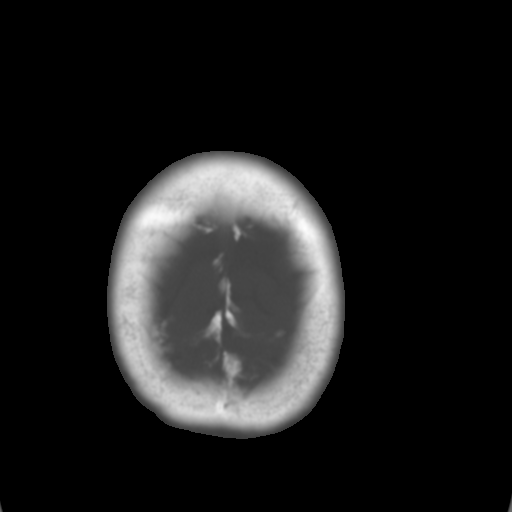

[Series 3: bone windows · axial · 0.42mm/px · z∈[-153,-42]mm · 7 of 57 slices shown]
[im 7/57  bone]
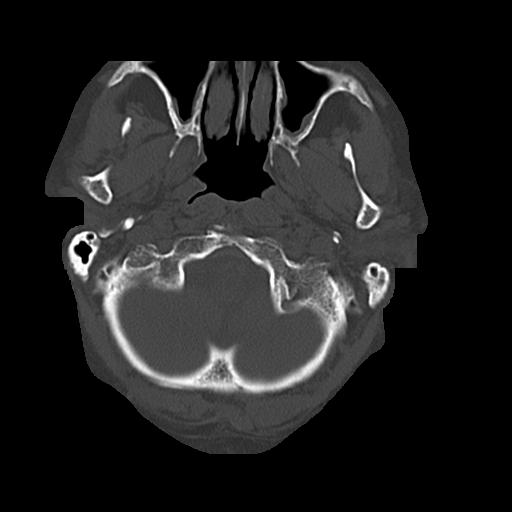
[im 13/57  bone]
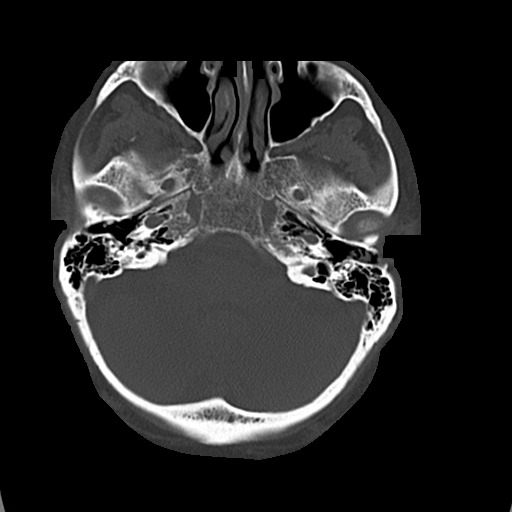
[im 19/57  bone]
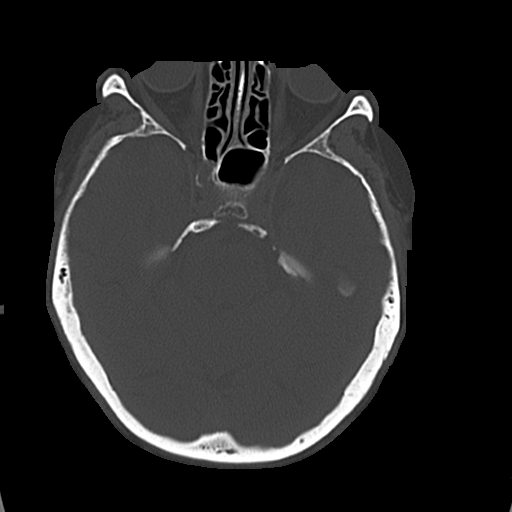
[im 25/57  bone]
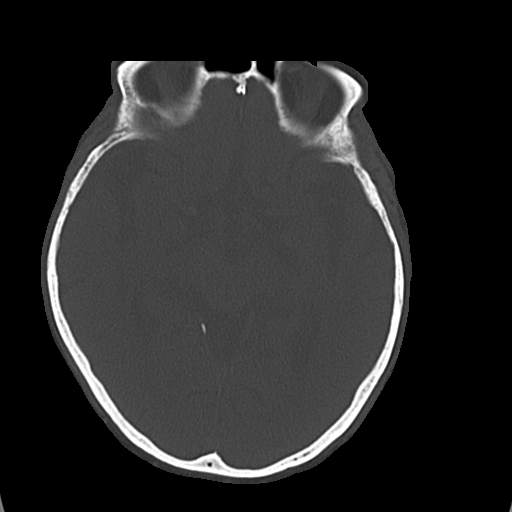
[im 32/57  bone]
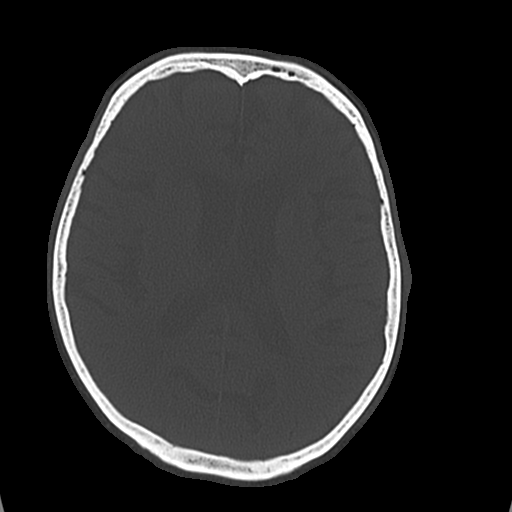
[im 38/57  bone]
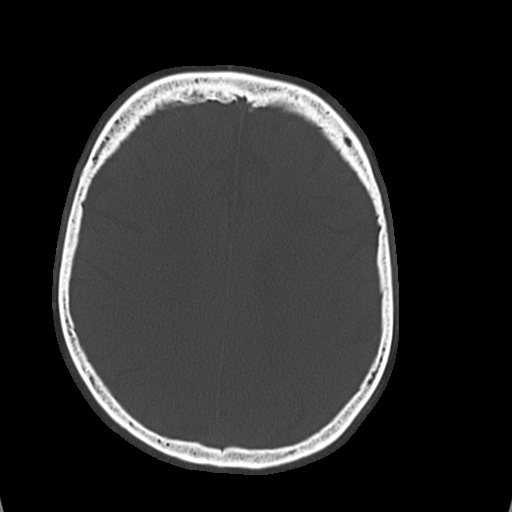
[im 44/57  bone]
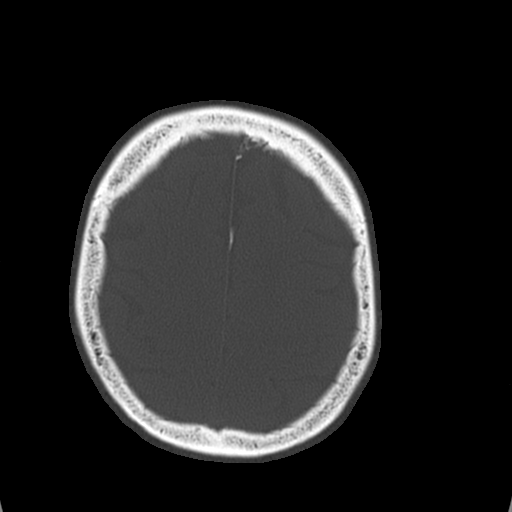

[16 of 30 positions shown; findings below may reference images not displayed]

FINDINGS: Remote infarct involving the left globus pallidus nucleus and
adjacent genu of the left internal capsule, chronic in appearance.

Periventricular white matter and corona radiata hypodensities favor
chronic ischemic microvascular white matter disease.

Otherwise, the brainstem, cerebellum, cerebral peduncles, thalami,
basal ganglia, basilar cisterns, and ventricular system appear
within normal limits. No intracranial hemorrhage, mass lesion, or
acute CVA. Scalp soft tissue swelling above the left ear.

Chronic bilateral maxillary, ethmoid, and left sphenoid sinusitis.
No fracture underlying the scalp hematoma.
IMPRESSION: 1. No acute intracranial findings.
2. Left-sided scalp hematoma just above the ear.
3. Periventricular white matter and corona radiata hypodensities
favor chronic ischemic microvascular white matter disease.
4. Remote infarct involving the left globus pallidus nucleus and
adjacent internal capsule.
5. Chronic maxillary, ethmoid, and left sphenoid sinusitis.
